# Patient Record
Sex: Female | Born: 1960
Health system: Southern US, Community
[De-identification: ages and names within clinical notes are randomized; demographics above are authoritative.]

## PROBLEM LIST (undated history)

## (undated) DIAGNOSIS — H8109 Meniere's disease, unspecified ear: Secondary | ICD-10-CM

## (undated) HISTORY — DX: Meniere's disease, unspecified ear: H81.09

---

## 1999-06-06 ENCOUNTER — Other Ambulatory Visit: Admission: RE | Admit: 1999-06-06 | Discharge: 1999-06-06 | Payer: Self-pay | Admitting: Internal Medicine

## 2000-12-25 ENCOUNTER — Other Ambulatory Visit: Admission: RE | Admit: 2000-12-25 | Discharge: 2000-12-25 | Payer: Self-pay | Admitting: Family Medicine

## 2001-11-16 ENCOUNTER — Encounter: Admission: RE | Admit: 2001-11-16 | Discharge: 2001-11-16 | Payer: Self-pay | Admitting: Urology

## 2001-11-16 ENCOUNTER — Encounter: Payer: Self-pay | Admitting: Urology

## 2002-02-03 ENCOUNTER — Other Ambulatory Visit: Admission: RE | Admit: 2002-02-03 | Discharge: 2002-02-03 | Payer: Self-pay | Admitting: Family Medicine

## 2002-04-04 ENCOUNTER — Ambulatory Visit (HOSPITAL_BASED_OUTPATIENT_CLINIC_OR_DEPARTMENT_OTHER): Admission: RE | Admit: 2002-04-04 | Discharge: 2002-04-04 | Payer: Self-pay | Admitting: Urology

## 2003-10-12 ENCOUNTER — Other Ambulatory Visit: Admission: RE | Admit: 2003-10-12 | Discharge: 2003-10-12 | Payer: Self-pay | Admitting: Family Medicine

## 2004-10-07 ENCOUNTER — Other Ambulatory Visit: Admission: RE | Admit: 2004-10-07 | Discharge: 2004-10-07 | Payer: Self-pay | Admitting: Family Medicine

## 2004-10-09 ENCOUNTER — Ambulatory Visit (HOSPITAL_COMMUNITY): Admission: RE | Admit: 2004-10-09 | Discharge: 2004-10-09 | Payer: Self-pay | Admitting: Family Medicine

## 2006-04-25 ENCOUNTER — Encounter: Admission: RE | Admit: 2006-04-25 | Discharge: 2006-04-25 | Payer: Self-pay | Admitting: Sports Medicine

## 2007-03-22 ENCOUNTER — Encounter: Admission: RE | Admit: 2007-03-22 | Discharge: 2007-03-22 | Payer: Self-pay | Admitting: Family Medicine

## 2007-04-07 ENCOUNTER — Ambulatory Visit: Payer: Self-pay | Admitting: Gastroenterology

## 2007-04-07 DIAGNOSIS — R945 Abnormal results of liver function studies: Secondary | ICD-10-CM | POA: Insufficient documentation

## 2010-05-08 ENCOUNTER — Other Ambulatory Visit (HOSPITAL_COMMUNITY)
Admission: RE | Admit: 2010-05-08 | Discharge: 2010-05-08 | Disposition: A | Discharge status: Home / Self Care | Payer: BC Managed Care – PPO | Source: Ambulatory Visit | Attending: Family Medicine | Admitting: Family Medicine

## 2010-05-08 ENCOUNTER — Other Ambulatory Visit: Payer: Self-pay | Admitting: Family Medicine

## 2010-05-08 DIAGNOSIS — Z0000 Encounter for general adult medical examination without abnormal findings: Secondary | ICD-10-CM | POA: Insufficient documentation

## 2010-05-21 NOTE — Assessment & Plan Note (Signed)
 HEALTHCARE                         GASTROENTEROLOGY OFFICE NOTE   NAME:Judy Middleton, Judy Middleton                        MRN:          [id number]  DATE:04/07/2007                            DOB:          Dec 04, 1960    REASON FOR CONSULTATION:  Abnormal liver tests.   Judy Middleton is a pleasant 50 year old white female referred through the  courtesy of Dr. Smith Mince for evaluation.  Abnormal liver tests were  noted in early February.  Several sets have demonstrated a very mild  transaminitis.  The transaminases have ranged from the low 40s to 64.  Serologies for hepatitis A, B, and C were negative, as was an ANA.  Last  set of liver tests on March 05, 2007 demonstrated an AST of 35 and an  ALT of 36.   Judy Middleton has no GI complaints, including abdominal pain, fever, change  of bowel habits.  There is no history of liver disease or jaundice.  She  does not use drugs and has not received a transfusion.   PAST MEDICAL HISTORY:  Pertinent for kidney stones.   FAMILY HISTORY:  Positive for a mother with celiac disease.  Grandmother  had colon cancer.   She is on no medications.   She has no allergies.   She does not smoke.  She drinks rarely.  She is married and works for a  [employer].   REVIEW OF SYSTEMS:  Positive for occasional joint pain.   PHYSICAL EXAMINATION:  There is no stigmata of liver disease.  Pulse 80, blood pressure 110/56.  HEENT:  EOMI.  PERRLA.  Sclerae are anicteric.  Conjunctivae are pink.  NECK:  Supple without thyromegaly, adenopathy or carotid bruits.  CHEST:  Clear to auscultation and percussion without adventitious  sounds.  CARDIAC:  Regular rhythm; normal S1 S2.  There are no murmurs, gallops  or rubs.  ABDOMEN:  Bowel sounds are normoactive.  Abdomen is soft, nontender and  nondistended.  There are no abdominal masses, tenderness, splenic  enlargement or hepatomegaly.  EXTREMITIES:  Full range of motion.  No  cyanosis, clubbing or edema.  RECTAL:  Deferred.   IMPRESSION:  Transient mild transaminitis:  This could represent a  subclinical viral infection with minimal liver test abnormalities.  There is no evidence for underlying liver disease.   RECOMMENDATION:  1. No further GI workup.  I would repeat her liver tests in      approximately three months.  2. Screening colonoscopy at age 26.     Barbette Hair. Arlyce Dice, MD,FACG  Electronically Signed    RDK/MedQ  DD: 04/07/2007  DT: 04/07/2007  Job #: [id number]   cc:   Talmadge Coventry, M.D.

## 2010-05-21 NOTE — Letter (Signed)
April 07, 2007    Judy Middleton. Judy Middleton   Judy Middleton, Judy Middleton  MRN:  [id number]  /  DOB:  1960-10-15   Dear [name]:   It is my pleasure to have treated you recently as a new patient in my  office.  I appreciate your confidence and the opportunity to participate  in your care.   Since I do have a busy inpatient endoscopy schedule and office schedule,  my office hours vary weekly.  I am, however, available for emergency  calls every day through my office.  If I cannot promptly meet an urgent  office appointment, another one of our gastroenterologists will be able  to assist you.   My well-trained staff are prepared to help you at all times.  For  emergencies after office hours, a physician from our gastroenterology  section is always available through my 24-hour answering service.   While you are under my care, I encourage discussion of your questions  and concerns, and I will be happy to return your calls as soon as I am  available.   Once again, I welcome you as a new patient and I look forward to a happy  and healthy relationship.    Sincerely,      Barbette Hair. Arlyce Dice, MD,FACG  Electronically Signed   RDK/MedQ  DD: 04/07/2007  DT: 04/07/2007  Job #: [id number]

## 2010-05-21 NOTE — Letter (Signed)
April 07, 2007    Talmadge Coventry, M.D.  [address]  [place], [place] [address]   RE:  Judy, Middleton  MRN:  [id number]  /  DOB:  Apr 17, 1960   Dear Dr. Smith Mince:   Upon your kind referral, I had the pleasure of evaluating your patient  and I am pleased to offer my findings.  I saw Judy Miyoshi. Middleton in the  office today.  Enclosed is a copy of my progress note that details my  findings and recommendations.   Thank you for the opportunity to participate in your patient's care.    Sincerely,      Barbette Hair. Arlyce Dice, MD,FACG  Electronically Signed    RDK/MedQ  DD: 04/07/2007  DT: 04/07/2007  Job #: [id number]

## 2010-05-24 NOTE — Op Note (Signed)
NAME:  Judy Middleton, Judy Middleton                           ACCOUNT NO.:  [id number]   MEDICAL RECORD NO.:  [id number]                   PATIENT TYPE:  AMB   LOCATION:  [facility]                                 FACILITY:  [facility]   PHYSICIAN:  Mark C. Vernie Ammons, M.D.               DATE OF BIRTH:  [birth year]   DATE OF PROCEDURE:  04/04/2002  DATE OF DISCHARGE:                                 OPERATIVE REPORT   PREOPERATIVE DIAGNOSES:  Gross hematuria, possible right distal ureteral  filling defect.   POSTOPERATIVE DIAGNOSES:  Hematuria.   SURGEON:  Mark C. Vernie Ammons, M.D.   ANESTHESIA:  General.   PROCEDURE:  Cystoscopy and bilateral retrograde pyelograms with  interpretation.   DRAINS:  None.   SPECIMENS:  None.   ESTIMATED BLOOD LOSS:  None.   COMPLICATIONS:  None.   INDICATIONS FOR PROCEDURE:  The patient is a 50 year old white female who  has had painless microscopic hematuria evaluated by me in the past. It seems  to occur after running. She has undergone a CT scan, cystoscopy and right  retrograde pyelogram all of which have showed no abnormality. Her cytology  has been checked and that has been negative; however, an IVP in November of  2003 showed what appeared to be a filling defect in the distal ureter. She  has since developed gross hematuria when she runs but a repeat IVP in my  office revealed no renal abnormalities and what may have again been a subtle  right distal ureteral filling defect. She was brought to the OR today for  further investigation of that.   DESCRIPTION OF PROCEDURE:  After informed consent, the patient brought to  the major OR, placed on the table and administered general LMA anesthesia.  She was removed to the dorsal lithotomy position and her genitalia was  sterilely prepped and draped. A 21 French cystoscope was then introduced in  the bladder, the bladder was fully inspected and noted to be free of tumor,  stones or inflammatory lesions. There was squamous  trigonal metaplasia. Both  ureteral orifices were of normal position. The left orifice was golf hole,  the right orifice was golf hole in configuration but much more patulous. No  other findings were identified, the urethra appeared normal as well.   Retrograde pyelogram was performed using a 6 [place] open ended ureteral  catheter passed into the distal right ureteral orifice. This study revealed,  with injection of 1/2 strength contrast, an entirely normal ureter  throughout its course. I filled the renal pelvis and noted the entire  collecting system is normal as well and then as I watched the contrast flow  down the ureter under direct fluoroscopic visualization, I noted no filling  defect or other abnormality whatsoever. The distal ureter was evaluated  several times with contrast being injected in under fluoroscopy and then  watched and drained. I am entirely convinced  there is no abnormality in the  distal right ureter at this time. The left ureter was also steadied with  retrograde pyelogram with no abnormal findings being identified on that side  either.   The patient was awakened and taken to the recovery room in stable and  satisfactory condition. She tolerated the procedure well with no  intraoperative complications. Currently the source of her hematuria remains  obscure although it seems to be related to her running. She could have a  small stone in her kidney but at this point it is undoubtedly from a benign  source and I feel confident in continuing to follow this conservatively.  Consider repeat CT scan again in six months.                                               Mark C. Vernie Ammons, M.D.    MCO/MEDQ  D:  04/04/2002  T:  04/04/2002  Job:  [id number]   cc:   Talmadge Coventry, M.D.  [address], [address]  [place]  [place] [address]  Fax: [phone]

## 2012-07-21 ENCOUNTER — Encounter: Payer: Self-pay | Admitting: Sports Medicine

## 2012-07-21 ENCOUNTER — Ambulatory Visit
Admission: RE | Admit: 2012-07-21 | Discharge: 2012-07-21 | Disposition: A | Discharge status: Home / Self Care | Payer: BC Managed Care – PPO | Source: Ambulatory Visit | Attending: Sports Medicine | Admitting: Sports Medicine

## 2012-07-21 ENCOUNTER — Ambulatory Visit (INDEPENDENT_AMBULATORY_CARE_PROVIDER_SITE_OTHER): Payer: BC Managed Care – PPO | Admitting: Sports Medicine

## 2012-07-21 VITALS — BP 108/74 | HR 62 | Ht 59.0 in | Wt 95.0 lb

## 2012-07-21 DIAGNOSIS — M25552 Pain in left hip: Secondary | ICD-10-CM

## 2012-07-21 DIAGNOSIS — M25562 Pain in left knee: Secondary | ICD-10-CM

## 2012-07-21 DIAGNOSIS — M25559 Pain in unspecified hip: Secondary | ICD-10-CM

## 2012-07-21 DIAGNOSIS — M25561 Pain in right knee: Secondary | ICD-10-CM

## 2012-07-21 DIAGNOSIS — M25569 Pain in unspecified knee: Secondary | ICD-10-CM

## 2012-07-21 NOTE — Progress Notes (Signed)
  Subjective:    Patient ID: Judy Middleton, female    DOB: June 15, 1960, 51 y.o.   MRN: [id number]  HPI chief complaint: Left hip pain  52 year old female comes in today complaining of 2-1/2 months of left hip pain. She experienced a sudden onset of sharp pain in the left groin while simply walking. She also felt a pop at the same time. Symptoms improved over the next couple of days but returned a few days later when she was playing tennis. As a result she has stopped playing tennis. Although her pain was initially in her groin she is now getting pain more diffusely around the entire left hip. Some pain in the posterior hip as well as along the lateral hip. She states that her pain has improved quite a bit over the past week or so and that now she just experiences an intermittent catch in the posterior portion of her hip but the most bothersome feature to her is a burning sensation in the left groin. Her pain does not result in her limping. She has not had any significant problems with her hip in the past. She denies any radiating pain down the leg. No associated numbness or tingling. No real pain at rest. No pain in the right hip.  She is also complaining of bilateral knee pain. It is mild in nature an aching in quality. She localizes it along the medial joint line. No mechanical symptoms. No swelling. No giving way. No recent trauma. No prior knee surgeries.  She is otherwise healthy. No known drug allergies.  Medications are reviewed Socially she does not smoke, drinks wine on occasion, and does not list an employer on her patient questionnaire    Review of Systems     Objective:   Physical Exam Well-developed, petite. No acute distress. Awake alert and oriented x3 vital signs are reviewed  Left hip: Smooth painless hip range of motion with a negative log roll. There is some slight tenderness to palpation at the origin of the adductor tendons but no real pain with resisted hip adduction. No  palpable defect. No soft tissue swelling. No pain with resisted hip flexion. She is tender to palpation slightly over the greater trochanteric bursa. Negative straight leg raise.  Examination of each knee shows full range of motion. No effusion. No significant patellofemoral crepitus. Mild tenderness to palpation along the medial joint line bilaterally but a negative McMurray's. No tenderness along the lateral joint line. Each knee is stable to ligamentous exam. Neurovascularly intact distally. Walking without significant limp.  X-rays including an AP pelvis, lateral left hip, as well as AP and lateral views of each knee are reviewed. She has minimal degenerative changes in her knees otherwise unremarkable. Hips show no significant degenerative changes and no signs of stress fracture.       Assessment & Plan:  1. Left hip pain secondary to abductor strain/possible partial tear 2. 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 Given her prescription to work with Ellamae Sia and she will followup with me in 4 weeks. I've reassured her that at this point in time I am not suspicious of stress fracture but I think it would be wise for her to wait for return to tennis. In fact, she tells me that she is planning on resting until fall tennis begins. If symptoms persist a followup I would consider merits of further diagnostic imaging.

## 2012-09-01 ENCOUNTER — Ambulatory Visit: Payer: BC Managed Care – PPO | Admitting: Sports Medicine

## 2012-11-11 ENCOUNTER — Other Ambulatory Visit: Payer: Self-pay | Admitting: Otolaryngology

## 2012-11-11 DIAGNOSIS — H903 Sensorineural hearing loss, bilateral: Secondary | ICD-10-CM

## 2012-11-21 ENCOUNTER — Ambulatory Visit
Admission: RE | Admit: 2012-11-21 | Discharge: 2012-11-21 | Disposition: A | Discharge status: Home / Self Care | Payer: BC Managed Care – PPO | Source: Ambulatory Visit | Attending: Otolaryngology | Admitting: Otolaryngology

## 2012-11-21 ENCOUNTER — Other Ambulatory Visit: Payer: BC Managed Care – PPO

## 2012-11-21 ENCOUNTER — Other Ambulatory Visit: Payer: Self-pay | Admitting: Otolaryngology

## 2012-11-21 DIAGNOSIS — H903 Sensorineural hearing loss, bilateral: Secondary | ICD-10-CM

## 2012-11-21 MED ORDER — GADOBENATE DIMEGLUMINE 529 MG/ML IV SOLN
9.0000 mL | Freq: Once | INTRAVENOUS | Status: AC | PRN
  Administered 2012-11-21: 9 mL via INTRAVENOUS

## 2012-12-01 ENCOUNTER — Other Ambulatory Visit (HOSPITAL_COMMUNITY): Payer: Self-pay | Admitting: Interventional Radiology

## 2012-12-01 DIAGNOSIS — I729 Aneurysm of unspecified site: Secondary | ICD-10-CM

## 2012-12-03 ENCOUNTER — Other Ambulatory Visit (HOSPITAL_COMMUNITY): Payer: Self-pay | Admitting: Interventional Radiology

## 2012-12-03 ENCOUNTER — Ambulatory Visit (HOSPITAL_COMMUNITY)
Admission: RE | Admit: 2012-12-03 | Discharge: 2012-12-03 | Disposition: A | Discharge status: Home / Self Care | Payer: BC Managed Care – PPO | Source: Ambulatory Visit | Attending: Interventional Radiology | Admitting: Interventional Radiology

## 2012-12-03 DIAGNOSIS — H9312 Tinnitus, left ear: Secondary | ICD-10-CM

## 2012-12-03 DIAGNOSIS — I729 Aneurysm of unspecified site: Secondary | ICD-10-CM

## 2012-12-06 ENCOUNTER — Encounter (HOSPITAL_COMMUNITY): Payer: Self-pay | Admitting: Pharmacy Technician

## 2012-12-06 ENCOUNTER — Other Ambulatory Visit: Payer: Self-pay | Admitting: Radiology

## 2012-12-06 ENCOUNTER — Other Ambulatory Visit (HOSPITAL_COMMUNITY): Payer: Self-pay | Admitting: Radiology

## 2012-12-06 HISTORY — PX: CEREBRAL ANGIOGRAM: SHX1326

## 2012-12-08 ENCOUNTER — Ambulatory Visit (HOSPITAL_COMMUNITY)
Admission: RE | Admit: 2012-12-08 | Discharge: 2012-12-08 | Disposition: A | Discharge status: Home / Self Care | Payer: BC Managed Care – PPO | Source: Ambulatory Visit | Attending: Interventional Radiology | Admitting: Interventional Radiology

## 2012-12-08 ENCOUNTER — Other Ambulatory Visit (HOSPITAL_COMMUNITY): Payer: Self-pay | Admitting: Interventional Radiology

## 2012-12-08 ENCOUNTER — Encounter (HOSPITAL_COMMUNITY): Payer: Self-pay

## 2012-12-08 DIAGNOSIS — I729 Aneurysm of unspecified site: Secondary | ICD-10-CM

## 2012-12-08 DIAGNOSIS — I671 Cerebral aneurysm, nonruptured: Secondary | ICD-10-CM | POA: Insufficient documentation

## 2012-12-08 DIAGNOSIS — H9319 Tinnitus, unspecified ear: Secondary | ICD-10-CM | POA: Insufficient documentation

## 2012-12-08 DIAGNOSIS — H9312 Tinnitus, left ear: Secondary | ICD-10-CM

## 2012-12-08 DIAGNOSIS — Z79899 Other long term (current) drug therapy: Secondary | ICD-10-CM | POA: Insufficient documentation

## 2012-12-08 LAB — CBC WITH DIFFERENTIAL/PLATELET
Basophils Absolute: 0 10*3/uL (ref 0.0–0.1)
Basophils Relative: 1 % (ref 0–1)
Eosinophils Absolute: 0.2 10*3/uL (ref 0.0–0.7)
Eosinophils Relative: 3 % (ref 0–5)
Lymphocytes Relative: 35 % (ref 12–46)
MCHC: 33.7 g/dL (ref 30.0–36.0)
MCV: 88.3 fL (ref 78.0–100.0)
Monocytes Absolute: 0.4 10*3/uL (ref 0.1–1.0)
Monocytes Relative: 7 % (ref 3–12)
Neutro Abs: 2.8 10*3/uL (ref 1.7–7.7)
Platelets: 223 10*3/uL (ref 150–400)
RBC: 4.61 MIL/uL (ref 3.87–5.11)
RDW: 12.4 % (ref 11.5–15.5)
WBC: 5.2 10*3/uL (ref 4.0–10.5)

## 2012-12-08 LAB — APTT: aPTT: 26 seconds (ref 24–37)

## 2012-12-08 LAB — PROTIME-INR: Prothrombin Time: 12.2 seconds (ref 11.6–15.2)

## 2012-12-08 LAB — BASIC METABOLIC PANEL
BUN: 14 mg/dL (ref 6–23)
CO2: 28 mEq/L (ref 19–32)
Calcium: 9.7 mg/dL (ref 8.4–10.5)
Creatinine, Ser: 0.66 mg/dL (ref 0.50–1.10)
GFR calc Af Amer: >90 mL/min (ref >90)

## 2012-12-08 MED ORDER — IOHEXOL 300 MG/ML  SOLN
150.0000 mL | Freq: Once | INTRAMUSCULAR | Status: AC | PRN
  Administered 2012-12-08: 130 mL via INTRA_ARTERIAL

## 2012-12-08 MED ORDER — SODIUM CHLORIDE 0.9 % IV SOLN: INTRAVENOUS | Status: AC

## 2012-12-08 MED ORDER — ONDANSETRON HCL 4 MG/2ML IJ SOLN: 4.0000 mg | Freq: Once | INTRAMUSCULAR | Status: DC

## 2012-12-08 MED ORDER — SODIUM CHLORIDE 0.9 % IV SOLN
Freq: Once | INTRAVENOUS | Status: AC
  Administered 2012-12-08: 75 mL/h via INTRAVENOUS

## 2012-12-08 MED ORDER — IODIXANOL 320 MG/ML IV SOLN
100.0000 mL | Freq: Once | INTRAVENOUS | Status: AC | PRN
  Administered 2012-12-08: 21 mL via INTRAVENOUS

## 2012-12-08 MED ORDER — FENTANYL CITRATE 0.05 MG/ML IJ SOLN
INTRAMUSCULAR | Status: AC | PRN
  Administered 2012-12-08: 12.5 ug via INTRAVENOUS
  Administered 2012-12-08 (×2): 25 ug via INTRAVENOUS
  Administered 2012-12-08: 12.5 ug via INTRAVENOUS
  Administered 2012-12-08: 25 ug via INTRAVENOUS

## 2012-12-08 MED ORDER — FENTANYL CITRATE 0.05 MG/ML IJ SOLN
INTRAMUSCULAR | Status: AC
  Filled 2012-12-08: qty 4

## 2012-12-08 MED ORDER — MIDAZOLAM HCL 2 MG/2ML IJ SOLN
INTRAMUSCULAR | Status: AC | PRN
  Administered 2012-12-08: 0.5 mg via INTRAVENOUS
  Administered 2012-12-08 (×2): 1 mg via INTRAVENOUS
  Administered 2012-12-08: 0.5 mg via INTRAVENOUS
  Administered 2012-12-08: 1 mg via INTRAVENOUS

## 2012-12-08 MED ORDER — HEPARIN SODIUM (PORCINE) 1000 UNIT/ML IJ SOLN
INTRAMUSCULAR | Status: AC | PRN
  Administered 2012-12-08: 500 [IU] via INTRAVENOUS
  Administered 2012-12-08: 1000 [IU] via INTRAVENOUS

## 2012-12-08 MED ORDER — MIDAZOLAM HCL 2 MG/2ML IJ SOLN
INTRAMUSCULAR | Status: AC
  Filled 2012-12-08: qty 6

## 2012-12-08 MED ORDER — ONDANSETRON HCL 4 MG/2ML IJ SOLN
INTRAMUSCULAR | Status: AC
  Administered 2012-12-08: 11:00:00
  Filled 2012-12-08: qty 2

## 2012-12-08 NOTE — H&P (Signed)
Chief Complaint: "Left ear ringing, fullness and pulsatile sound."  HPI: Judy Middleton is an 52 y.o. female who has been seen in consult on 12/03/12 for abnormal MRI/MRA findings and symptoms of left ear fullness starting September 05, 2012 after landing in a plane. She states her symptoms have progressed to ringing worse when rotating her head to the right, and pulsatile sound worse with climbing stairs or exercising. She denies any headaches, extremity weakness, slurred speech, or syncope. She states her symptoms have been getting progressively worse. She denies any known allergy to iodinated contrast or difficulty with sedation. She denies any chest pain or shortness of breath today. She denies taking any blood thinners or active bleeding, blood in her stool or urine. She denies any recent illness, fever or chills. 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 The patient is here today for a diagnostic cerebral arteriogram.  Past Medical History: History reviewed. No pertinent past medical history.  Past Surgical History: Exploratory Laparotomy   Family History: No family history on file.  Social History:  reports that she has never smoked. She has never used smokeless tobacco. Her alcohol and drug histories are not on file.  Allergies: No Known Allergies    Medication List    ASK your doctor about these medications       mometasone 50 MCG/ACT nasal spray  Commonly known as:  NASONEX  Place 2 sprays into both nostrils daily as needed (congestion/allergies).     MULTIPLE VITAMINS/WOMENS PO  Take 1 tablet by mouth daily.     PREMARIN vaginal cream  Generic drug:  conjugated estrogens  Place 1 Applicatorful vaginally 2 (two) times a week.     SYSTANE BALANCE 0.6 % Soln  Generic drug:  Propylene Glycol  Place 1-2 drops into both eyes daily as needed (dryness).     VAGIFEM 10 MCG Tabs vaginal tablet  Generic drug:   Estradiol  Place 1 tablet vaginally 2 (two) times a week.     VIACTIV 500-500-40 MG-UNT-MCG Chew  Generic drug:  Calcium-Vitamin D-Vitamin K  Chew 1 tablet by mouth daily.       Please HPI for pertinent positives, otherwise complete 10 system ROS negative.  Physical Exam: BP 108/70  Pulse 64  Temp(Src) 98 F (36.7 C) (Oral)  Resp 18  Ht 4\' 10"  (1.473 m)  Wt 95 lb (43.092 kg)  BMI 19.86 kg/m2  SpO2 98% Body mass index is 19.86 kg/(m^2).  General Appearance:  Alert, cooperative, no distress  Head:  Normocephalic, without obvious abnormality, atraumatic  Neck: Supple, symmetrical, trachea midline  Lungs:   Clear to auscultation bilaterally, no w/r/r, respirations unlabored without use of accessory muscles.  Chest Wall:  No tenderness or deformity  Heart:  Regular rate and rhythm, S1, S2 normal, no murmur, rub or gallop.  Extremities: Extremities normal, atraumatic, no cyanosis or edema  Pulses: 2+ and symmetric  Neurologic: Normal affect, no gross deficits.   Results for orders placed during the hospital encounter of 12/08/12 (from the past 48 hour(s))  APTT     Status: None   Collection Time    12/08/12  8:16 AM      Result Value Range   aPTT 26  24 - 37 seconds  CBC WITH DIFFERENTIAL     Status: None   Collection Time    12/08/12  8:16 AM      Result Value Range   WBC  5.2  4.0 - 10.5 K/uL   RBC 4.61  3.87 - 5.11 MIL/uL   Hemoglobin 13.7  12.0 - 15.0 g/dL   HCT 13.2  44.0 - 10.2 %   MCV 88.3  78.0 - 100.0 fL   MCH 29.7  26.0 - 34.0 pg   MCHC 33.7  30.0 - 36.0 g/dL   RDW 72.5  36.6 - 44.0 %   Platelets 223  150 - 400 K/uL   Neutrophils Relative % 55  43 - 77 %   Neutro Abs 2.8  1.7 - 7.7 K/uL   Lymphocytes Relative 35  12 - 46 %   Lymphs Abs 1.8  0.7 - 4.0 K/uL   Monocytes Relative 7  3 - 12 %   Monocytes Absolute 0.4  0.1 - 1.0 K/uL   Eosinophils Relative 3  0 - 5 %   Eosinophils Absolute 0.2  0.0 - 0.7 K/uL   Basophils Relative 1  0 - 1 %   Basophils  Absolute 0.0  0.0 - 0.1 K/uL  PROTIME-INR     Status: None   Collection Time    12/08/12  8:16 AM      Result Value Range   Prothrombin Time 12.2  11.6 - 15.2 seconds   INR 0.92  0.00 - 1.49   No results found.  Assessment/Plan Left ear tinnitus. Left carotid artery infundibulum versus aneurysm on MRI/MRA  Scheduled for cerebral arteriogram today. Patient has been NPO, labs and images reviewed, no blood thinners Risks and Benefits discussed with the patient. All of the patient's questions were answered, patient is agreeable to proceed. Consent signed and in chart.   Pattricia Boss D PA-C 12/08/2012, 9:05 AM

## 2012-12-08 NOTE — Procedures (Signed)
S/P 4 vessel cerebral arteriogram. RT CFa approach. Findings. 1.appro 4.5 mm x 7.9mm LT vertebral artery  X 2 fusiform aneurysms  Involving horizontal segment , associated with a small1.67mm saccular aneurysm associated with 50 % stenosis.  2.No aneurysm seen on Lt ICA intracranially.

## 2012-12-08 NOTE — ED Notes (Signed)
MD at bedside. 

## 2012-12-08 NOTE — Progress Notes (Signed)
Discharge instructions given by Darcel Smalling, RN.  Pt and family verbalize understanding

## 2012-12-08 NOTE — ED Notes (Signed)
Patient denies pain and is resting comfortably.  

## 2012-12-08 NOTE — ED Notes (Signed)
Patient denies pain and is resting comfortably. She does state she is feeling very anxious

## 2012-12-08 NOTE — ED Notes (Signed)
Family updated as to patient's status per MD 

## 2012-12-08 NOTE — ED Notes (Signed)
Pt denies nausea at this time but is worried she will. She has requested to be given zofran. VO order received from MD, zofran to be administered per pt request

## 2013-01-27 ENCOUNTER — Other Ambulatory Visit (HOSPITAL_COMMUNITY): Payer: Self-pay | Admitting: Interventional Radiology

## 2013-01-27 DIAGNOSIS — H9319 Tinnitus, unspecified ear: Secondary | ICD-10-CM

## 2013-02-17 ENCOUNTER — Telehealth (HOSPITAL_COMMUNITY): Payer: Self-pay | Admitting: Interventional Radiology

## 2013-02-17 NOTE — Telephone Encounter (Signed)
Called pt left VM to confirm March appt and to [facility]

## 2013-03-11 ENCOUNTER — Other Ambulatory Visit (HOSPITAL_COMMUNITY): Payer: Self-pay | Admitting: Interventional Radiology

## 2013-03-11 ENCOUNTER — Ambulatory Visit (HOSPITAL_COMMUNITY)
Admission: RE | Admit: 2013-03-11 | Discharge: 2013-03-11 | Disposition: A | Discharge status: Home / Self Care | Payer: BC Managed Care – PPO | Source: Ambulatory Visit | Attending: Interventional Radiology | Admitting: Interventional Radiology

## 2013-03-11 DIAGNOSIS — I720 Aneurysm of carotid artery: Secondary | ICD-10-CM

## 2013-03-11 DIAGNOSIS — H9319 Tinnitus, unspecified ear: Secondary | ICD-10-CM

## 2013-03-11 DIAGNOSIS — I6529 Occlusion and stenosis of unspecified carotid artery: Secondary | ICD-10-CM | POA: Insufficient documentation

## 2013-03-11 DIAGNOSIS — I7774 Dissection of vertebral artery: Secondary | ICD-10-CM | POA: Insufficient documentation

## 2013-03-11 MED ORDER — IOHEXOL 350 MG/ML SOLN
50.0000 mL | Freq: Once | INTRAVENOUS | Status: AC | PRN
  Administered 2013-03-11: 50 mL via INTRAVENOUS

## 2013-03-16 ENCOUNTER — Other Ambulatory Visit (HOSPITAL_COMMUNITY): Payer: Self-pay | Admitting: Interventional Radiology

## 2013-03-16 DIAGNOSIS — I7774 Dissection of vertebral artery: Secondary | ICD-10-CM

## 2013-03-16 DIAGNOSIS — H9319 Tinnitus, unspecified ear: Secondary | ICD-10-CM

## 2013-03-18 ENCOUNTER — Ambulatory Visit (HOSPITAL_COMMUNITY): Admission: RE | Admit: 2013-03-18 | Payer: BC Managed Care – PPO | Source: Ambulatory Visit

## 2013-08-19 ENCOUNTER — Ambulatory Visit (INDEPENDENT_AMBULATORY_CARE_PROVIDER_SITE_OTHER): Payer: Self-pay | Admitting: Internal Medicine

## 2013-08-19 DIAGNOSIS — Z7189 Other specified counseling: Secondary | ICD-10-CM

## 2013-08-19 DIAGNOSIS — Z7184 Encounter for health counseling related to travel: Secondary | ICD-10-CM

## 2013-08-19 MED ORDER — ATOVAQUONE-PROGUANIL HCL 250-100 MG PO TABS: 1.0000 | ORAL_TABLET | Freq: Every day | ORAL | Status: DC

## 2013-08-19 MED ORDER — AZITHROMYCIN 500 MG PO TABS: 1000.0000 mg | ORAL_TABLET | Freq: Once | ORAL | Status: DC

## 2013-08-19 NOTE — Progress Notes (Signed)
  Subjective:    Judy Middleton is a 53 y.o. female who presents to the Infectious Disease clinic for travel consultation. Planned departure date: October 2015          Planned return date: 8 days Countries of travel: [place]  Areas in country: [place]   Accommodations: compound Purpose of travel: health care Prior travel out of [place]: yes Currently ill / Fever: no History of liver or kidney disease: no  Data Review:  no issues   Review of Systems n/a    Objective:    n/a    Assessment:    No contraindications to travel. none      Plan:    Issues discussed: environmental concerns, future shots, insect-borne illnesses, malaria, MVA safety, rabies, safe food/water, traveler's diarrhea, website/handouts for more information, what to do if ill upon return and what to do if ill while there. Immunizations recommended: none indicated.up to date with typhoid, hepatitis A, B Malaria prophylaxis: malarone, daily dose starting 1-2 days before entering endemic area, ending 7 days after leaving area Traveler's diarrhea prophylaxis: azithromycin.doesn't tolerate cipro Total duration of visit: 1 Hour. Total time spent on education, counseling, coordination of care: 30 Minutes.

## 2013-08-29 ENCOUNTER — Encounter: Payer: Self-pay | Admitting: Nurse Practitioner

## 2013-08-29 ENCOUNTER — Ambulatory Visit (INDEPENDENT_AMBULATORY_CARE_PROVIDER_SITE_OTHER): Payer: BC Managed Care – PPO | Admitting: Nurse Practitioner

## 2013-08-29 VITALS — BP 100/70 | HR 68 | Ht <= 58 in | Wt 90.0 lb

## 2013-08-29 DIAGNOSIS — Z0000 Encounter for general adult medical examination without abnormal findings: Secondary | ICD-10-CM

## 2013-08-29 DIAGNOSIS — Z01419 Encounter for gynecological examination (general) (routine) without abnormal findings: Secondary | ICD-10-CM

## 2013-08-29 MED ORDER — ESTRADIOL 2 MG VA RING: 2.0000 mg | VAGINAL_RING | VAGINAL | Status: DC

## 2013-08-29 NOTE — Progress Notes (Signed)
Patient ID: Judy Middleton, female   DOB: 1960/12/27, 53 y.o.   MRN: [id number] 53 y.o. [id number] Married Caucasian Fe here for NGYN annual exam.  She has problems with vaginal dryness and UTI secondary to atrophy.  She is currently using Vagifem 10 mcg twice weekly and pea size amount of Premarin to the urethra.  Initially this was helpful but no so much now.  She has dyspareunia most of the time.  She is interested in other vaginal methods of estrogen replacement.  She has otherwise been very healthy and feels well.  Patient's last menstrual period was 01/07/2012.          Sexually active: Yes.    The current method of family planning is post menopausal status.    Exercising: Yes.    Gym/ health club routine includes running, walking and tennis. Smoker:  no  Health Maintenance: Pap:  05/08/10, negative, no history of abnormal MMG:  11/2012, normal per patient Colonoscopy:  02/2010, normal, repeat in 5 years due to family history of colon cancer BMD:   2011, normal per patient TDaP:  2007 Labs: PCP, Dr. Merri Brunette, have apt in January   reports that she has never smoked. She has never used smokeless tobacco. She reports that she drinks about 1.8 ounces of alcohol per week. She reports that she does not use illicit drugs.  Past Medical History  Diagnosis Date  . Cochlear hydrops     Past Surgical History  Procedure Laterality Date  . Cerebral angiogram  12/2012    Hydrops of the left ear. Vascular Neuro thought nothing acute.  CT angiogram 3/15 was stable.    Current Outpatient Prescriptions  Medication Sig Dispense Refill  . Calcium-Vitamin D-Vitamin K (VIACTIV) 500-500-40 MG-UNT-MCG CHEW Chew 1 tablet by mouth daily.      . mometasone (NASONEX) 50 MCG/ACT nasal spray Place 2 sprays into both nostrils daily as needed (congestion/allergies).      . Multiple Vitamins-Minerals (MULTIPLE VITAMINS/WOMENS PO) Take 1 tablet by mouth daily.      . potassium chloride (K-DUR) 10 MEQ tablet Take  2 tablets by mouth daily.      Marland Kitchen PREMARIN vaginal cream Place 1 Applicatorful vaginally 2 (two) times a week.       Marland Kitchen Propylene Glycol (SYSTANE BALANCE) 0.6 % SOLN Place 1-2 drops into both eyes daily as needed (dryness).      . triamterene-hydrochlorothiazide (DYAZIDE) 37.5-25 MG per capsule Take 1 capsule by mouth daily.      Marland Kitchen atovaquone-proguanil (MALARONE) 250-100 MG TABS Take 1 tablet by mouth daily. Start 2 days prior to travel to malaria area, throughout travel and for 7 days upon return.  17 tablet  0  . azithromycin (ZITHROMAX) 500 MG tablet Take 2 tablets (1,000 mg total) by mouth once. Take 2 tabs once for Traveler's diarrhea  4 tablet  0  . estradiol (ESTRING) 2 MG vaginal ring Place 2 mg vaginally every 3 (three) months. Insert a new ring into vagina every 3 months  3 each  3   No current facility-administered medications for this visit.    Family History  Problem Relation Age of Onset  . Osteoporosis Mother   . Celiac disease Mother   . Heart disease Maternal Grandmother   . Heart attack Maternal Grandmother   . Heart attack Maternal Grandfather   . Heart disease Maternal Grandfather   . Parkinson's disease Paternal Grandfather   . Colon cancer Paternal Grandfather     ROS:  Pertinent items are noted in HPI.  Otherwise, a comprehensive ROS was negative.  Exam:   BP 100/70  Pulse 68  Ht 4' 9.5" (1.461 m)  Wt 90 lb (40.824 kg)  BMI 19.13 kg/m2  LMP 01/07/2012 Height: 4' 9.5" (146.1 cm)  Ht Readings from Last 3 Encounters:  08/29/13 4' 9.5" (1.461 m)  12/08/12  (1.473 m)  07/21/12  (1.499 m)    General appearance: alert, cooperative and appears stated age Head: Normocephalic, without obvious abnormality, atraumatic Neck: no adenopathy, supple, symmetrical, trachea midline and thyroid normal to inspection and palpation Lungs: clear to auscultation bilaterally Breasts: normal appearance, no masses or tenderness Heart: regular rate and rhythm Abdomen:  soft, non-tender; no masses,  no organomegaly Extremities: extremities normal, atraumatic, no cyanosis or edema Skin: Skin color, texture, turgor normal. No rashes or lesions Lymph nodes: Cervical, supraclavicular, and axillary nodes normal. No abnormal inguinal nodes palpated Neurologic: Grossly normal   Pelvic: External genitalia:  no lesions, external atrophic signs              Urethra:  normal appearing urethra with no masses, tenderness or lesions              Bartholin's and Skene's: normal                 Vagina: atrophic appearing vagina with pale color and discharge, no lesions              Cervix: anteverted              Pap taken: Yes.   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    She is willing to try the Estring to see if helpful.  Also compliance may be better for her.  Plan to see her back in 3 months with ring in place and discuss removal and to see how easy it will be for her.  Otherwise we can remove as needed.  RX given for Estring # 1/ RF X 3  Counseled with risk of DVT, CVA, cancer, etc.  She will also use EVOO with a OB tampon every few days and wear the tampon for an hour or so and remove.  Counseled on breast self exam, mammography screening, use and side effects of HRT, menopause, osteoporosis, adequate intake of calcium and vitamin D, diet and exercise, Kegel's exercises return  annually or prn  An After Visit Summary was printed and given to the patient.

## 2013-08-29 NOTE — Patient Instructions (Signed)

## 2013-09-01 LAB — IPS PAP TEST WITH HPV

## 2013-09-04 NOTE — Progress Notes (Signed)
Encounter reviewed by Dr. Brook Silva.  

## 2013-10-21 ENCOUNTER — Other Ambulatory Visit (HOSPITAL_COMMUNITY): Payer: Self-pay | Admitting: Interventional Radiology

## 2013-10-21 ENCOUNTER — Telehealth (HOSPITAL_COMMUNITY): Payer: Self-pay | Admitting: Interventional Radiology

## 2013-10-21 DIAGNOSIS — I7774 Dissection of vertebral artery: Secondary | ICD-10-CM

## 2013-10-21 DIAGNOSIS — H9319 Tinnitus, unspecified ear: Secondary | ICD-10-CM

## 2013-10-21 NOTE — Telephone Encounter (Signed)
Called pt, left message w/ her husband for her to call to schedule her 6 month f/u MRI/MRA brain. JM

## 2013-10-24 ENCOUNTER — Other Ambulatory Visit: Payer: Self-pay | Admitting: Neurosurgery

## 2013-10-24 DIAGNOSIS — I7774 Dissection of vertebral artery: Secondary | ICD-10-CM

## 2013-11-07 ENCOUNTER — Encounter: Payer: Self-pay | Admitting: Nurse Practitioner

## 2013-11-25 ENCOUNTER — Encounter: Payer: Self-pay | Admitting: Nurse Practitioner

## 2013-11-25 ENCOUNTER — Ambulatory Visit (INDEPENDENT_AMBULATORY_CARE_PROVIDER_SITE_OTHER): Payer: BC Managed Care – PPO | Admitting: Nurse Practitioner

## 2013-11-25 VITALS — BP 100/70 | HR 72 | Resp 16 | Ht <= 58 in | Wt 94.0 lb

## 2013-11-25 DIAGNOSIS — N952 Postmenopausal atrophic vaginitis: Secondary | ICD-10-CM

## 2013-11-25 MED ORDER — ESTROGENS, CONJUGATED 0.625 MG/GM VA CREA: TOPICAL_CREAM | VAGINAL | Status: DC

## 2013-11-25 NOTE — Patient Instructions (Signed)
Continue Estring and Premarin vaginal cream pea size amount to the urethra twice weekly

## 2013-11-25 NOTE — Progress Notes (Signed)
53 y.o. WM female [id number] here for follow up of atrophic vaginitis treated with Estring vaginal ring initiated on 08/29/13.  She was using the Vagifem twice weekly and Premarin vaginal cream at the urethra for dyspareunia and recurrent UTI's.  She felt like the Vagifem was not helpful and SA was very painful.  She desired another course of treatment.  The Estring was then given and she has removed it this am without problems.  She feels better over all with dryness but feels she still has to use the Premarin at the urethra to reduce spasm and urethritis symptoms,  SA has been much more comfortable.   Denies any symptoms of UTI, vaginal bleeding, or spotting.    O: Healthy WD,WN female Affect: normal Abdomen: soft and non tender Pelvic exam:EXTERNAL GENITALIA: normal appearing with some atrophic changes and vulva with no masses, tenderness or lesions, urethral prolapse is slight better VAGINA: atrophic changes are improved, no abnormal discharge or lesions CERVIX: no lesions or cervical motion tenderness UTERUS: gravid and anteverted ADNEXA: no masses palpable and non tender  A: Atrophic vaginitis improved with Estring     P:  Discussed findings of atrophic vaginitis  Labs none indicated  Recheck at AEX  Will continue with Premarin pea size amount to urethra  Will continue with Estring    RV

## 2013-11-27 NOTE — Progress Notes (Signed)
Encounter reviewed by Dr. Micala Saltsman Silva.  

## 2014-02-10 IMAGING — CR DG PELVIS 1-2V
1 series · 1 of 1 positions shown · non-contrast
Comparison: CT urogram of 07/03/2004

CLINICAL DATA: Left hip pain

PELVIS - 1-2 VIEW

[t pelvis a.p.]
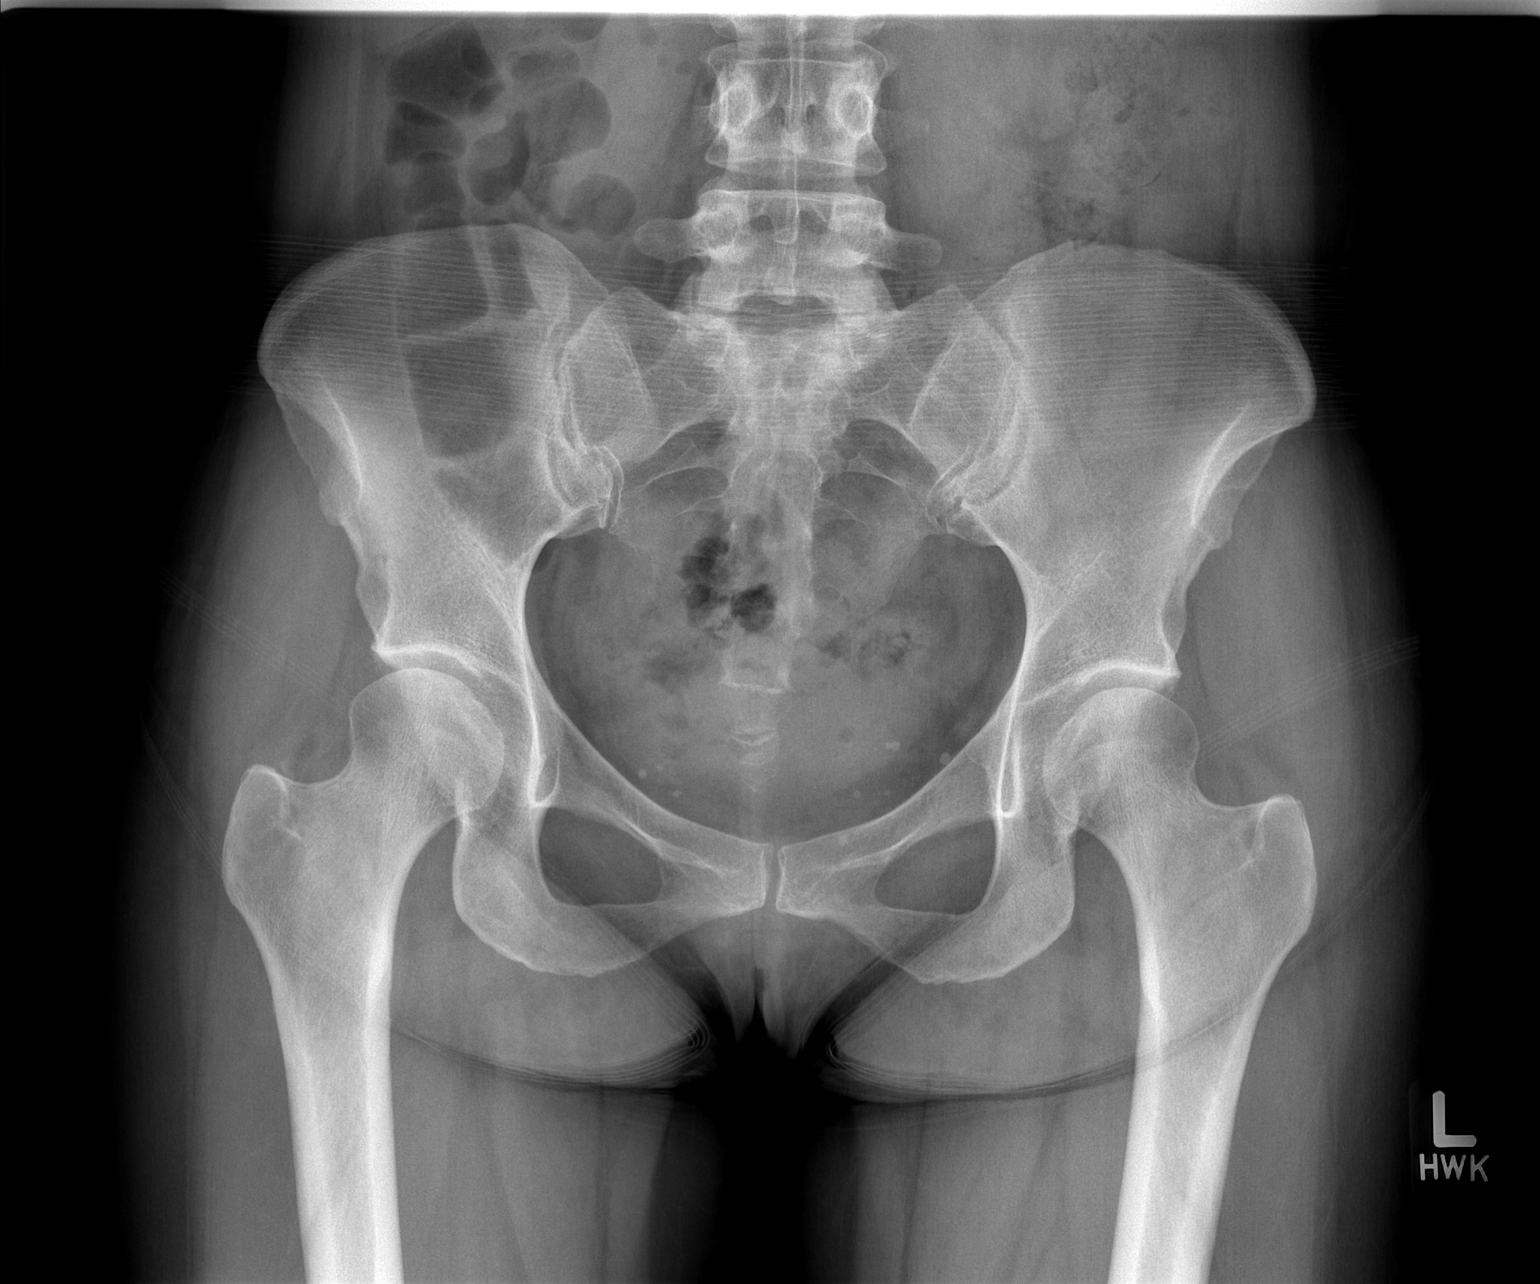

[1 of 1 positions shown; findings below may reference images not displayed]

FINDINGS: Both hip joint spaces appear normal.  No significant
degenerative change is seen.  The pelvic rami are intact, and the
SI joints appear normal.
IMPRESSION: Negative views of the hips.

## 2014-03-13 ENCOUNTER — Other Ambulatory Visit: Payer: Self-pay | Admitting: *Deleted

## 2014-03-13 NOTE — Telephone Encounter (Signed)
Fax Medication refill request: Estring 7.5 mcg  Last AEX:  08/29/13 with PG  Next AEX: 09/04/14 with PG Last MMG (if hormonal medication request): 11/2012 normal per patient according to AEX note  Refill authorized: #3/1 rfs, please advise.

## 2014-03-13 NOTE — Telephone Encounter (Signed)
See if Mammo is current with another in 11/2013??

## 2014-03-14 MED ORDER — ESTRADIOL 2 MG VA RING: 2.0000 mg | VAGINAL_RING | VAGINAL | Status: DC

## 2014-03-14 NOTE — Telephone Encounter (Signed)
No refills unless Mammo is done.

## 2014-03-14 NOTE — Telephone Encounter (Signed)
Called and s/w patient she said she had it done in November of 2015. Called [facility] she had it done 11/24/13 requested report.  Results: Breast Density Category D: Bi-Rads 1: Negative   (Report is in your door)  Patient says she knows you sent in her refills x 1 year to local pharmacy but she has to use mail order now. Please advise.

## 2014-06-30 IMAGING — XA IR ANGIO INTRA EXTRACRAN SEL COM CAROTID INNOMINATE BILAT MOD SE
1 series · 11 of 24 positions shown · IV contrast (IODINE)
Comparison: none

CLINICAL DATA: Left-sided pulsatile tinnitus. Abnormal MRA of the
brain.

[Series 300: neuro · 11 of 157 slices shown]
[im 7/157]
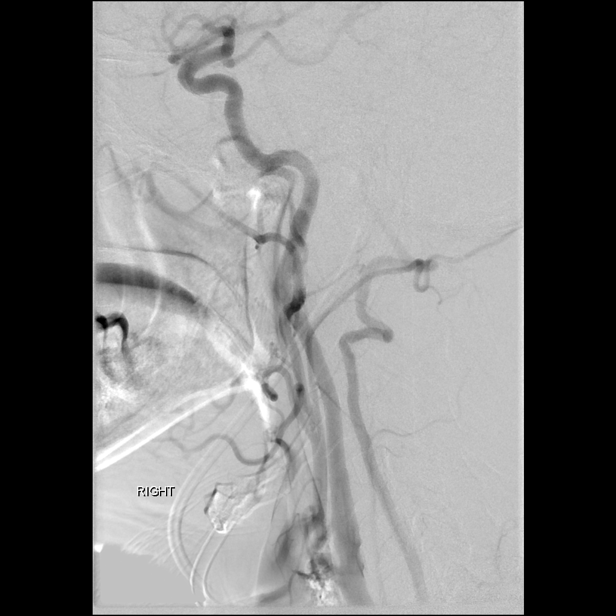
[im 21/157]
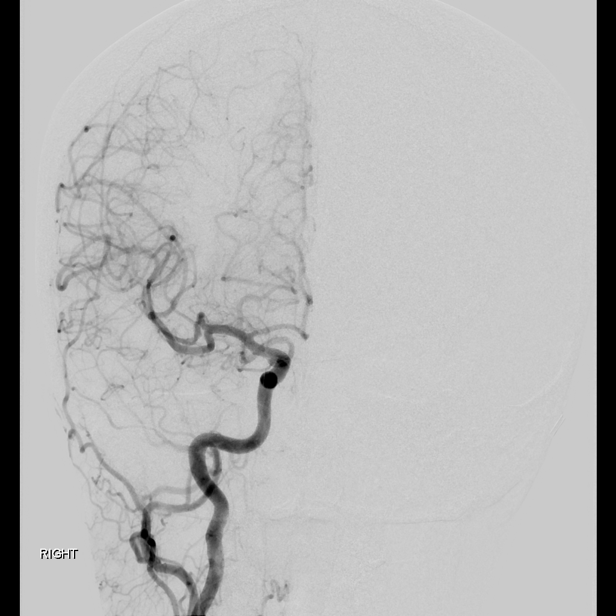
[im 34/157]
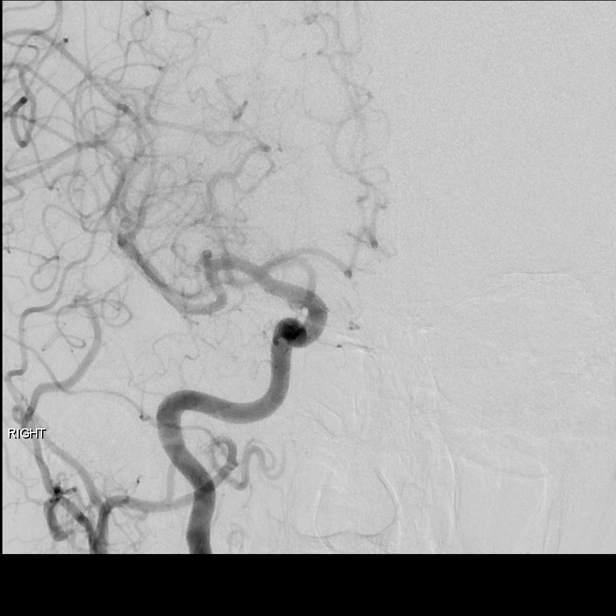
[im 48/157]
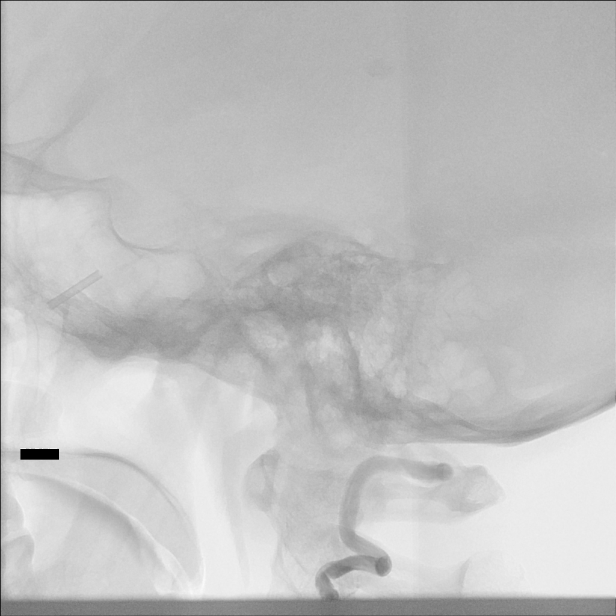
[im 62/157]
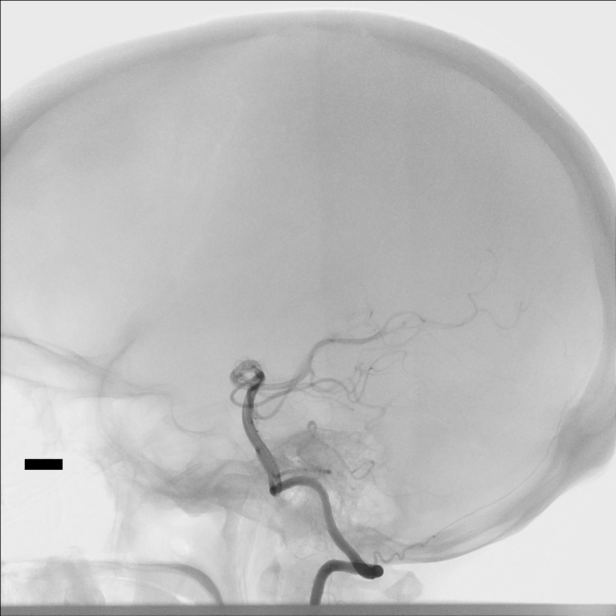
[im 82/157]
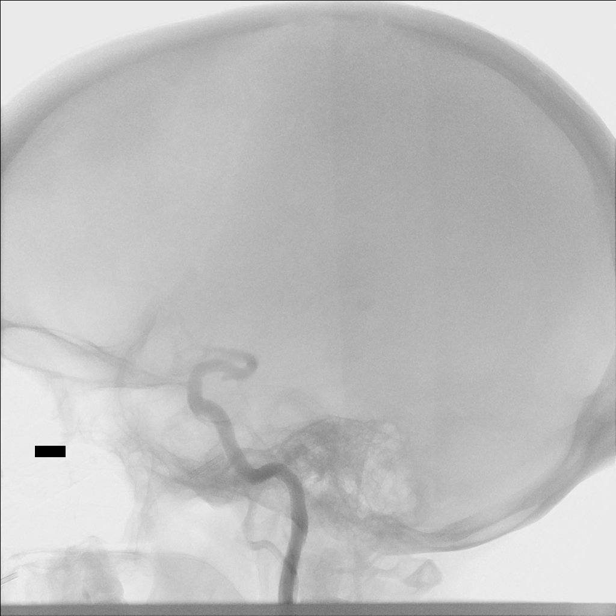
[im 95/157]
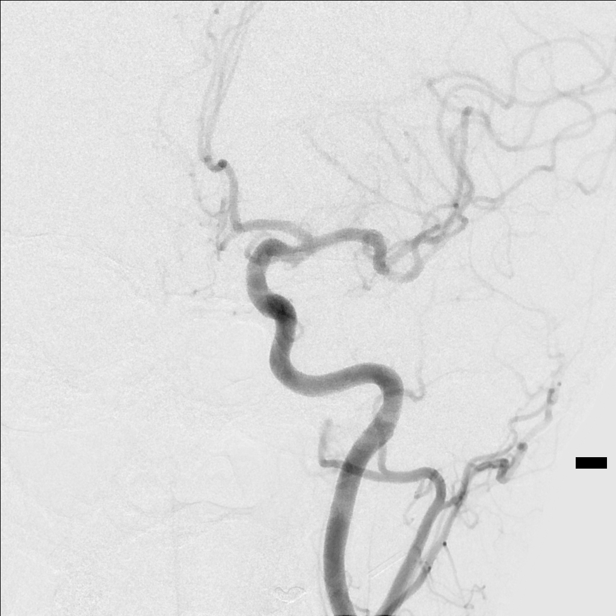
[im 109/157]
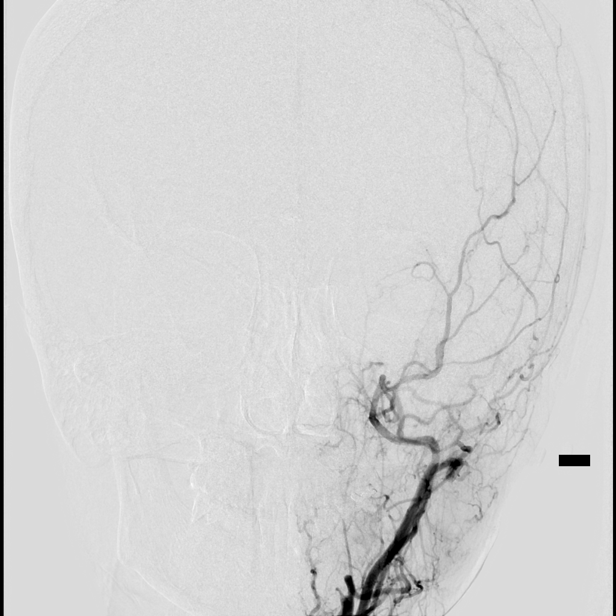
[im 123/157]
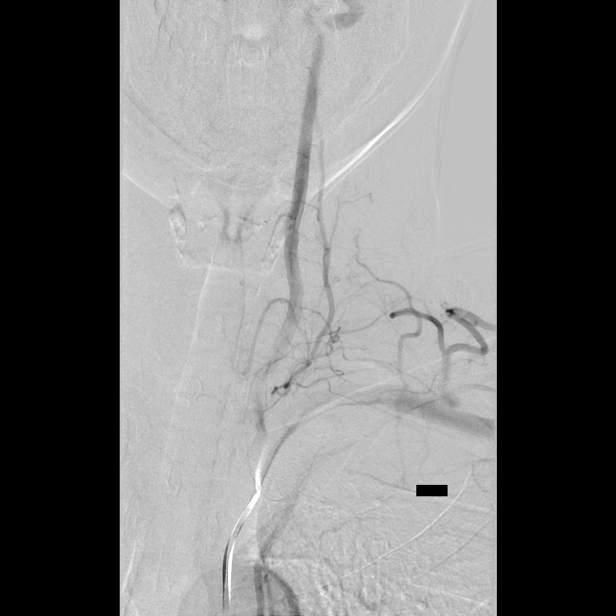
[im 136/157]
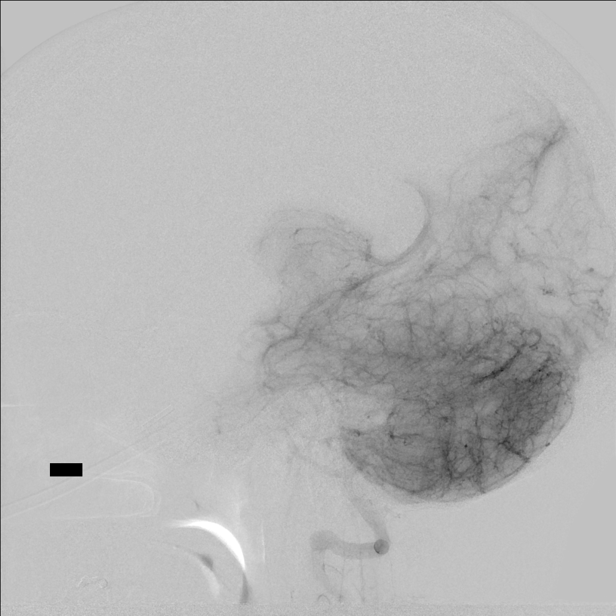
[im 150/157]
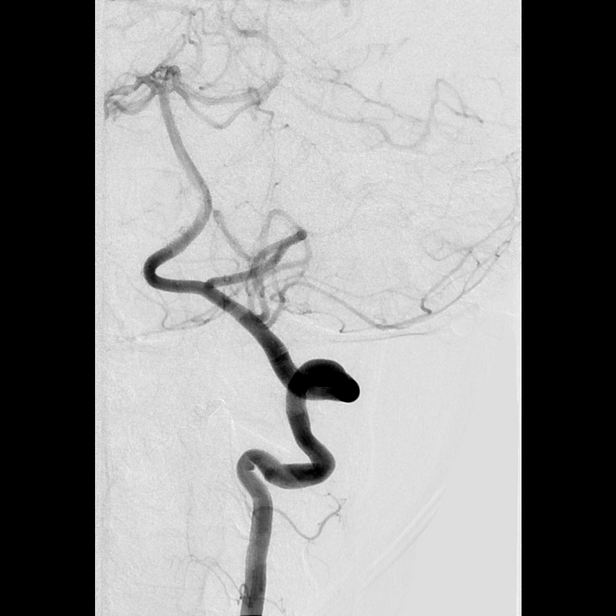

[11 of 24 positions shown; findings below may reference images not displayed]

EXAM:
BILATERAL COMMON CAROTID AND INNOMINATE ANGIOGRAPHY AND BILATERAL
VERTEBRAL ARTERY ANGIOGRAMS:

MEDICATIONS:
Versed 4 mg IV. Fentanyl 100 mcg IV.

ANESTHESIA/SEDATION:
Conscious sedation.

CONTRAST:  130mL OMNIPAQUE IOHEXOL 300 MG/ML SOLN, 21mL VISIPAQUE
IODIXANOL 320 MG/ML IV SOLN

PROCEDURE:
Following a full explanation of the procedure along with the
potential associated complications, an informed witnessed consent
was obtained.

The right groin was prepped and draped in the usual sterile fashion.
Thereafter using a modified Seldinger technique, transfemoral access
into the right common femoral artery was obtained without
difficulty. Over a 0.035-inch guidewire, a 5 French Pinnacle sheath
was inserted. Through this and also over a 0.035-inch guidewire, a 5
French JB1 catheter was advanced to the aortic arch region and
selectively positioned in the right common carotid artery, the right
vertebral artery, the left common carotid artery, the left external
carotid artery and left vertebral artery.

Also performed was a 3D rotational arteriogram centered over the
left anterior circulation followed by reconstruction on a separate
workstation.

There were no acute complications. The patient tolerated the
procedure well.

COMPLICATIONS:
None immediate.
FINDINGS: The right common carotid arteriogram demonstrates the right external
carotid artery and its major branches to be normal.

The right internal carotid artery at the bulb to the cranial skull
base also opacifies normally.

The petrous, cavernous and supraclinoid segments are widely patent.

The right posterior communicating artery opacification is seen
opacifying the right posterior cerebral artery distribution.

The right middle cerebral artery and the right anterior cerebral
artery opacify normally into the capillary and venous phases. The
venous phase also demonstrates the presence of a prominent inferior
petrosal sinus on the right draining the ipsilateral cavernous sinus
and the sphenoid parietal sinus.

The right vertebral artery origin is normal.

The vessel opacifies normally to the cranial skull base. There is
normal opacification of the right posterior-inferior cerebellar
artery and the right vertebrobasilar junction.

A right posterior-inferior cerebellar artery/posterior inferior
cerebellar artery complex is seen. The basilar artery, the left
posterior cerebral artery, the superior cerebellar arteries and the
left anterior-inferior cerebellar arteries opacifies normally into
the capillary and venous phases. Non-visualization of the right
posterior cerebral artery is probably related to in flow from the
anterior circulation.

The left common carotid arteriogram demonstrates the left external
carotid artery and its major branches to be normal.

The left internal carotid artery at the bulb to the cranial skull
base opacifies normally.

The petrous, cavernous and supraclinoid segments are widely patent.

The left middle and the left anterior cerebral arteries opacify
normally into the capillary and venous phases. Cross opacification
via the anterior communicating artery of the right anterior cerebral
artery A1 segment is seen. Prominent origins of the left anterior
carotid artery and left posterior cerebral artery are seen.

The venous phase also demonstrates hypoplasia of the left transverse
sinus.

A prominent vein of the base is seen projecting into the left
transverse sinus and subsequently the sigmoid sinus. The 3D
rotational arteriogram of the left anterior circulation with
subsequent reconstruction again demonstrates the left internal
carotid artery and the petrous cavernous and supraclinoid segments
to be devoid of any saccular outpouchings.

The left external carotid arteriogram demonstrates normal
opacification of the left external carotid artery branches. There is
no early shunting of blood seen into the venous circulation intra or
extracranially.

The left vertebral artery origin is normal.

The vessel opacifies normally through the level of C2.

The 1st horizontal segment demonstrates fusiform dilatation which
measures 7.4 mm x 4.2 mm and is associated with a proximal stenosis
of approximately 50%. Also associated with this is a small saccular
outpouching from the lateral aspect of the left vertebral artery
which measures approximately 1.2 mm.

However, distal to the junction of the 2nd vertical segment of the
left vertebral artery demonstrated is a fusiform aneurysmal
dilatation. This measures approximately 6.5mm x 4.5 mm.

Proximal to the 1st fusiform aneurysm is a focal narrowing of
approximately 50% stenosis with a saccular outpouching measuring
approximately 1.2 mm x 1.1 mm probably representing a small saccular
aneurysm.

Distal to this the left vertebrobasilar junction opacifies normally.
Normal opacification is seen of the left posterior inferior
cerebellar artery and the left vertebrobasilar junction.

The basilar artery, the posterior cerebral arteries, the superior
cerebellar arteries and the anterior inferior cerebellar arteries
opacify normally into the capillary and venous phases.

There were no acute complications. The patient tolerated the
procedure well.
IMPRESSION: No evidence of intracranial aneurysm involving the left internal
carotid artery on the 3D rotational reconstruction arteriogram.

Presence of a fusiform aneurysm measuring 7.4 mm x 4.2 mm, and
distal to this a 2nd fusiform aneurysm measuring 6.5 mm x 4.5 mm.
Associated smaller saccular aneurysm measuring approximately 1.2 mm
at the proximal aspect of the proximal fusiform aneurysm associated
with a 50% stenosis. These findings are highly suggestive of a
nonflow limiting healing dissection.

No evidence of intraluminal filling defects is seen.

The angiographic findings were reviewed with the patient and the
patient's husband.

She was advised to start one baby aspirin per day. She was also
advised to refrain from indulging in activities involving rotational
and hyperextension and hyperflexion movements of the cervical spine.

A follow-up CT angiogram of the head and neck will be undertaken in
approximately 3 months' time. She has been advised to call for any
concerns or questions that she might have. A follow-up appointment
subsequent to the CT angiogram will be scheduled.

## 2014-09-04 ENCOUNTER — Encounter: Payer: Self-pay | Admitting: Nurse Practitioner

## 2014-09-04 ENCOUNTER — Ambulatory Visit (INDEPENDENT_AMBULATORY_CARE_PROVIDER_SITE_OTHER): Payer: BLUE CROSS/BLUE SHIELD | Admitting: Nurse Practitioner

## 2014-09-04 VITALS — BP 100/64 | HR 68 | Ht <= 58 in | Wt 89.0 lb

## 2014-09-04 DIAGNOSIS — Z01419 Encounter for gynecological examination (general) (routine) without abnormal findings: Secondary | ICD-10-CM | POA: Diagnosis not present

## 2014-09-04 DIAGNOSIS — Z0000 Encounter for general adult medical examination without abnormal findings: Secondary | ICD-10-CM | POA: Diagnosis not present

## 2014-09-04 DIAGNOSIS — Z23 Encounter for immunization: Secondary | ICD-10-CM | POA: Diagnosis not present

## 2014-09-04 MED ORDER — ESTRADIOL 2 MG VA RING
2.0000 mg | VAGINAL_RING | VAGINAL | Status: DC
Start: 2014-09-04 — End: 2015-09-24

## 2014-09-04 MED ORDER — ESTROGENS, CONJUGATED 0.625 MG/GM VA CREA: TOPICAL_CREAM | VAGINAL | Status: DC

## 2014-09-04 NOTE — Patient Instructions (Signed)

## 2014-09-04 NOTE — Progress Notes (Signed)
Patient ID: Judy Middleton, female   DOB: 1960-12-25, 54 y.o.   MRN: [id number] 54 y.o. [id number] Married  Caucasian Fe here for annual exam.  No vaso symptoms.  Still some vaginal dryness but improved with vaginal estrogen. Using coconut oil prn.  Patient's last menstrual period was 01/07/2012.          Sexually active: Yes.    The current method of family planning is post menopausal status.    Exercising: Yes.    walking 2-3 miles everyday, plays tennis 1-2 times per week, goes to gym 1-2 times per week, yoga 1-2 times per week, ride 1 mile everyday Smoker:  no  Health Maintenance: Pap: 08/29/13, negative with neg HR HPV, no history of abnormal MMG: 11/24/13, 3D, Bi-Rads 1:  Negative Colonoscopy: 02/2010, normal, repeat in 5 years due to family history of colon cancer BMD: 2011, normal per patient TDaP: 2007 Labs:  PCP   reports that she has never smoked. She has never used smokeless tobacco. She reports that she drinks about 1.8 oz of alcohol per week. She reports that she does not use illicit drugs.  Past Medical History  Diagnosis Date  . Cochlear hydrops     Past Surgical History  Procedure Laterality Date  . Cerebral angiogram  12/2012    Hydrops of the left ear. Vascular Neuro thought nothing acute.  CT angiogram 3/15 was stable.    Current Outpatient Prescriptions  Medication Sig Dispense Refill  . Calcium-Vitamin D-Vitamin K (VIACTIV) 500-500-40 MG-UNT-MCG CHEW Chew 1 tablet by mouth daily.    Marland Kitchen conjugated estrogens (PREMARIN) vaginal cream Use as directed to urethra twice weekly 42.5 g 3  . estradiol (ESTRING) 2 MG vaginal ring Place 2 mg vaginally every 3 (three) months. Insert a new ring into vagina every 3 months 3 each 4  . mometasone (NASONEX) 50 MCG/ACT nasal spray Place 2 sprays into both nostrils daily as needed (congestion/allergies).    . Multiple Vitamins-Minerals (MULTIPLE VITAMINS/WOMENS PO) Take 1 tablet by mouth daily.    . potassium chloride (K-DUR) 10  MEQ tablet Take 2 tablets by mouth daily.    Marland Kitchen Propylene Glycol (SYSTANE BALANCE) 0.6 % SOLN Place 1-2 drops into both eyes daily as needed (dryness).    . triamterene-hydrochlorothiazide (DYAZIDE) 37.5-25 MG per capsule Take 1 capsule by mouth daily.     No current facility-administered medications for this visit.    Family History  Problem Relation Age of Onset  . Osteoporosis Mother   . Celiac disease Mother   . Heart disease Maternal Grandmother   . Heart attack Maternal Grandmother   . Heart attack Maternal Grandfather   . Heart disease Maternal Grandfather   . Parkinson's disease Paternal Grandfather   . Colon cancer Paternal Grandfather     ROS:  Pertinent items are noted in HPI.  Otherwise, a comprehensive ROS was negative.  Exam:   BP 100/64 mmHg  Pulse 68  Ht 4' 9.5" (1.461 m)  Wt 89 lb (40.37 kg)  BMI 18.91 kg/m2  LMP 01/07/2012 Height: 4' 9.5" (146.1 cm) Ht Readings from Last 3 Encounters:  09/04/14 4' 9.5" (1.461 m)  11/25/13 4' 9.5" (1.461 m)  08/29/13 4' 9.5" (1.461 m)    General appearance: alert, cooperative and appears stated age Head: Normocephalic, without obvious abnormality, atraumatic Neck: no adenopathy, supple, symmetrical, trachea midline and thyroid normal to inspection and palpation Lungs: clear to auscultation bilaterally Breasts: normal appearance, no masses or tenderness Heart: regular rate and rhythm  Abdomen: soft, non-tender; no masses,  no organomegaly Extremities: extremities normal, atraumatic, no cyanosis or edema Skin: Skin color, texture, turgor normal. No rashes or lesions Lymph nodes: Cervical, supraclavicular, and axillary nodes normal. No abnormal inguinal nodes palpated Neurologic: Grossly normal   Pelvic: External genitalia:  no lesions              Urethra:  normal appearing urethra with no masses, tenderness or lesions              Bartholin's and Skene's: normal                 Vagina: normal appearing vagina with  normal color and discharge, no lesions              Cervix: anteverted              Pap taken: No. Bimanual Exam:  Uterus:  normal size, contour, position, consistency, mobility, non-tender              Adnexa: no mass, fullness, tenderness               Rectovaginal: Confirms               Anus:  normal sphincter tone, no lesions  Chaperone present: no  A:  Well Woman with normal exam   Postmenopausal no HRT Atrophic vaginitis - on vaginal estrogen FMH of osteoporosis and colon cancer History of Cochlear Hydrops - takes Dyazide for this  Update  immunization  P:   Reviewed health and wellness pertinent to exam  Pap smear as above  Mammogram is due 11/2014  Refill on Estring to use every 3 months  Refill on Premarin Vaginal cream to use at the urethra prn  Counseled with risk of CVA, DVT, cancer, etc.  Update TDaP today  Counseled on breast self exam, mammography screening, use and side effects of HRT, adequate intake of calcium and vitamin D, diet and exercise return annually or prn  An After Visit Summary was printed and given to the patient.

## 2014-09-06 NOTE — Progress Notes (Signed)
Encounter reviewed by Dr. Brook Amundson C. Silva.  

## 2014-11-21 ENCOUNTER — Telehealth (HOSPITAL_COMMUNITY): Payer: Self-pay

## 2014-11-21 NOTE — Telephone Encounter (Signed)
Called to schedule f/u MRI/MRA, pt stated that she did not want to do f/u exam. Pt was told to call if there were any changes. AW

## 2015-09-04 ENCOUNTER — Ambulatory Visit: Payer: BC Managed Care – PPO | Admitting: Nurse Practitioner

## 2015-09-05 ENCOUNTER — Ambulatory Visit: Payer: BLUE CROSS/BLUE SHIELD | Admitting: Nurse Practitioner

## 2015-09-24 ENCOUNTER — Ambulatory Visit (INDEPENDENT_AMBULATORY_CARE_PROVIDER_SITE_OTHER): Payer: BLUE CROSS/BLUE SHIELD | Admitting: Nurse Practitioner

## 2015-09-24 ENCOUNTER — Encounter: Payer: Self-pay | Admitting: Nurse Practitioner

## 2015-09-24 VITALS — BP 100/64 | HR 60 | Ht <= 58 in | Wt 90.0 lb

## 2015-09-24 DIAGNOSIS — Z0000 Encounter for general adult medical examination without abnormal findings: Secondary | ICD-10-CM

## 2015-09-24 DIAGNOSIS — Z01419 Encounter for gynecological examination (general) (routine) without abnormal findings: Secondary | ICD-10-CM

## 2015-09-24 DIAGNOSIS — N952 Postmenopausal atrophic vaginitis: Secondary | ICD-10-CM

## 2015-09-24 MED ORDER — ESTRADIOL 2 MG VA RING
2.0000 mg | VAGINAL_RING | VAGINAL | 4 refills | Status: DC
Start: 2015-09-24 — End: 2016-09-30

## 2015-09-24 MED ORDER — ESTROGENS, CONJUGATED 0.625 MG/GM VA CREA: TOPICAL_CREAM | VAGINAL | 3 refills | Status: DC

## 2015-09-24 MED ORDER — ESTRADIOL 2 MG VA RING: 2.0000 mg | VAGINAL_RING | VAGINAL | 4 refills | Status: DC

## 2015-09-24 NOTE — Progress Notes (Signed)
Patient ID: Judy Middleton, female   DOB: March 14, 1960, 55 y.o.   MRN: [id number]  55 y.o. 862P2002 Married  Caucasian Fe here for annual exam.  No new problems.  She just returned from a trip to [place].  Patient's last menstrual period was 01/07/2012.          Sexually active: Yes.    The current method of family planning is post menopausal status.    Exercising: Yes. Walking 2-3 miles everyday, plays tennis 1-2 times per week, goes to gym 1-2 times per week, yoga 1-2 times per week, run 1 mile everyday Smoker:  no  Health Maintenance: Pap: 08/29/13, negative with neg HR HPV MMG:01/29/15, 3D, Bi-Rads 1:  Negative Colonoscopy:02/2010, normal, repeat in 5 years due to family history of colon cancer BMD:2011, normal per patient TDaP:09/04/14 Pneumonia: Not indicated due to age Hep C: 2010, negative  HIV: pregnancy in 1995 Labs: PCP takes care of all labs   reports that she has never smoked. She has never used smokeless tobacco. She reports that she drinks about 1.8 oz of alcohol per week . She reports that she does not use drugs.  Past Medical History:  Diagnosis Date  . Cochlear hydrops     Past Surgical History:  Procedure Laterality Date  . CEREBRAL ANGIOGRAM  12/2012   Hydrops of the left ear. Vascular Neuro thought nothing acute.  CT angiogram 3/15 was stable.    Current Outpatient Prescriptions  Medication Sig Dispense Refill  . Calcium-Vitamin D-Vitamin K (VIACTIV) 500-500-40 MG-UNT-MCG CHEW Chew 1 tablet by mouth daily.    Marland Kitchen. conjugated estrogens (PREMARIN) vaginal cream Use as directed to urethra twice weekly 42.5 g 3  . estradiol (ESTRING) 2 MG vaginal ring Place 2 mg vaginally every 3 (three) months. Insert a new ring into vagina every 3 months 3 each 4  . mometasone (NASONEX) 50 MCG/ACT nasal spray Place 2 sprays into both nostrils daily as needed (congestion/allergies).    . Multiple Vitamins-Minerals (MULTIPLE VITAMINS/WOMENS PO) Take 1 tablet by mouth daily.    .  potassium chloride (K-DUR) 10 MEQ tablet Take 2 tablets by mouth daily.    Marland Kitchen. Propylene Glycol (SYSTANE BALANCE) 0.6 % SOLN Place 1-2 drops into both eyes daily as needed (dryness).    . triamterene-hydrochlorothiazide (DYAZIDE) 37.5-25 MG per capsule Take 1 capsule by mouth daily.     No current facility-administered medications for this visit.     Family History  Problem Relation Age of Onset  . Osteoporosis Mother   . Celiac disease Mother   . Heart disease Maternal Grandmother   . Heart attack Maternal Grandmother   . Heart attack Maternal Grandfather   . Heart disease Maternal Grandfather   . Parkinson's disease Paternal Grandfather   . Colon cancer Paternal Grandfather     ROS:  Pertinent items are noted in HPI.  Otherwise, a comprehensive ROS was negative.  Exam:   LMP 01/07/2012    Ht Readings from Last 3 Encounters:  09/04/14 4' 9.5" (1.461 m)  11/25/13 4' 9.5" (1.461 m)  08/29/13 4' 9.5" (1.461 m)    General appearance: alert, cooperative and appears stated age Head: Normocephalic, without obvious abnormality, atraumatic Neck: no adenopathy, supple, symmetrical, trachea midline and thyroid normal to inspection and palpation Lungs: clear to auscultation bilaterally Breasts: normal appearance, no masses or tenderness Heart: regular rate and rhythm Abdomen: soft, non-tender; no masses,  no organomegaly Extremities: extremities normal, atraumatic, no cyanosis or edema Skin: Skin color, texture, turgor  normal. No rashes or lesions Lymph nodes: Cervical, supraclavicular, and axillary nodes normal. No abnormal inguinal nodes palpated Neurologic: Grossly normal   Pelvic: External genitalia:  no lesions              Urethra:  normal appearing urethra with no masses, tenderness or lesions              Bartholin's and Skene's: normal                 Vagina: normal appearing vagina with normal color and discharge, no lesions              Cervix: anteverted               Pap taken: No. 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  Counseled with risk of CVA, DVT, cancer, etc.  Counseled on breast self exam, mammography screening, use and side effects of HRT, adequate intake of calcium and vitamin D, diet and exercise, Kegel's exercises return annually or prn  An After Visit Summary was printed and given to the patient.

## 2015-09-24 NOTE — Patient Instructions (Signed)

## 2015-09-28 NOTE — Progress Notes (Signed)
Encounter reviewed by Dr. Aubria Vanecek Amundson C. Silva.  

## 2015-11-13 DIAGNOSIS — M722 Plantar fascial fibromatosis: Secondary | ICD-10-CM | POA: Diagnosis not present

## 2015-11-14 DIAGNOSIS — H8102 Meniere's disease, left ear: Secondary | ICD-10-CM | POA: Diagnosis not present

## 2015-11-14 DIAGNOSIS — H903 Sensorineural hearing loss, bilateral: Secondary | ICD-10-CM | POA: Diagnosis not present

## 2015-11-14 DIAGNOSIS — H9312 Tinnitus, left ear: Secondary | ICD-10-CM | POA: Diagnosis not present

## 2015-11-14 DIAGNOSIS — I6502 Occlusion and stenosis of left vertebral artery: Secondary | ICD-10-CM | POA: Diagnosis not present

## 2016-02-13 DIAGNOSIS — D2271 Melanocytic nevi of right lower limb, including hip: Secondary | ICD-10-CM | POA: Diagnosis not present

## 2016-02-13 DIAGNOSIS — L570 Actinic keratosis: Secondary | ICD-10-CM | POA: Diagnosis not present

## 2016-02-13 DIAGNOSIS — D225 Melanocytic nevi of trunk: Secondary | ICD-10-CM | POA: Diagnosis not present

## 2016-02-13 DIAGNOSIS — D2262 Melanocytic nevi of left upper limb, including shoulder: Secondary | ICD-10-CM | POA: Diagnosis not present

## 2016-02-13 DIAGNOSIS — L821 Other seborrheic keratosis: Secondary | ICD-10-CM | POA: Diagnosis not present

## 2016-02-18 DIAGNOSIS — Z1231 Encounter for screening mammogram for malignant neoplasm of breast: Secondary | ICD-10-CM | POA: Diagnosis not present

## 2016-04-02 DIAGNOSIS — H5310 Unspecified subjective visual disturbances: Secondary | ICD-10-CM | POA: Diagnosis not present

## 2016-04-02 DIAGNOSIS — G43909 Migraine, unspecified, not intractable, without status migrainosus: Secondary | ICD-10-CM | POA: Diagnosis not present

## 2016-04-02 DIAGNOSIS — H43812 Vitreous degeneration, left eye: Secondary | ICD-10-CM | POA: Diagnosis not present

## 2016-07-14 DIAGNOSIS — H04123 Dry eye syndrome of bilateral lacrimal glands: Secondary | ICD-10-CM | POA: Diagnosis not present

## 2016-07-14 DIAGNOSIS — H43812 Vitreous degeneration, left eye: Secondary | ICD-10-CM | POA: Diagnosis not present

## 2016-07-14 DIAGNOSIS — H5213 Myopia, bilateral: Secondary | ICD-10-CM | POA: Diagnosis not present

## 2016-07-22 ENCOUNTER — Telehealth: Payer: Self-pay | Admitting: Certified Nurse Midwife

## 2016-07-22 NOTE — Telephone Encounter (Signed)
LMTCB/:NP/ .CX/LETTER SENT/RD ° °

## 2016-09-25 DIAGNOSIS — H9312 Tinnitus, left ear: Secondary | ICD-10-CM | POA: Diagnosis not present

## 2016-09-25 DIAGNOSIS — I6502 Occlusion and stenosis of left vertebral artery: Secondary | ICD-10-CM | POA: Diagnosis not present

## 2016-09-25 DIAGNOSIS — H8102 Meniere's disease, left ear: Secondary | ICD-10-CM | POA: Diagnosis not present

## 2016-09-25 DIAGNOSIS — H903 Sensorineural hearing loss, bilateral: Secondary | ICD-10-CM | POA: Diagnosis not present

## 2016-09-29 ENCOUNTER — Ambulatory Visit: Payer: BLUE CROSS/BLUE SHIELD | Admitting: Nurse Practitioner

## 2016-09-30 ENCOUNTER — Other Ambulatory Visit (HOSPITAL_COMMUNITY)
Admission: RE | Admit: 2016-09-30 | Discharge: 2016-09-30 | Disposition: A | Discharge status: Home / Self Care | Payer: BLUE CROSS/BLUE SHIELD | Source: Ambulatory Visit | Attending: Obstetrics & Gynecology | Admitting: Obstetrics & Gynecology

## 2016-09-30 ENCOUNTER — Encounter: Payer: Self-pay | Admitting: Certified Nurse Midwife

## 2016-09-30 ENCOUNTER — Ambulatory Visit: Payer: BLUE CROSS/BLUE SHIELD | Admitting: Certified Nurse Midwife

## 2016-09-30 VITALS — BP 98/62 | HR 60 | Resp 16 | Ht <= 58 in | Wt 90.0 lb

## 2016-09-30 DIAGNOSIS — N952 Postmenopausal atrophic vaginitis: Secondary | ICD-10-CM | POA: Diagnosis not present

## 2016-09-30 DIAGNOSIS — Z124 Encounter for screening for malignant neoplasm of cervix: Secondary | ICD-10-CM | POA: Insufficient documentation

## 2016-09-30 DIAGNOSIS — Z01419 Encounter for gynecological examination (general) (routine) without abnormal findings: Secondary | ICD-10-CM

## 2016-09-30 DIAGNOSIS — N951 Menopausal and female climacteric states: Secondary | ICD-10-CM | POA: Diagnosis not present

## 2016-09-30 MED ORDER — ESTROGENS, CONJUGATED 0.625 MG/GM VA CREA: TOPICAL_CREAM | VAGINAL | 1 refills | Status: DC

## 2016-09-30 MED ORDER — ESTROGENS, CONJUGATED 0.625 MG/GM VA CREA
TOPICAL_CREAM | VAGINAL | 3 refills | Status: DC
Start: 2016-09-30 — End: 2016-09-30

## 2016-09-30 MED ORDER — ESTRADIOL 2 MG VA RING: 2.0000 mg | VAGINAL_RING | VAGINAL | 4 refills | Status: DC

## 2016-09-30 NOTE — Progress Notes (Signed)
56 y.o. G61P2002 Married  Caucasian Fe here for annual exam.  Menopausal no HRT. Using Estring for atrophic vaginitis and using Premarin Cream twice weekly around urethral meatus to prevent UTI symptoms. Working well. Denies vaginal bleeding. Sees PCP yearly for labs. Exercises to feel fit. No health issues today.  Patient's last menstrual period was 01/07/2012.          Sexually active: Yes.    The current method of family planning is post menopausal status.    Exercising: Yes.    walk, yoga, run, tennis, weights Smoker:  no  Health Maintenance: Pap:  08-29-13 neg HPV HR neg History of Abnormal Pap: no MMG:  01-29-15 category d density birads 1:neg  Self Breast exams: no Colonoscopy:  2012 f/u 33yrs family hx BMD:   2011 TDaP:  2016 Shingles: 2018 Pneumonia: not done Hep C and HIV: both neg in the past Labs: no   reports that she has never smoked. She has never used smokeless tobacco. She reports that she drinks about 1.2 - 1.8 oz of alcohol per week . She reports that she does not use drugs.  Past Medical History:  Diagnosis Date  . Cochlear hydrops     Past Surgical History:  Procedure Laterality Date  . CEREBRAL ANGIOGRAM  12/2012   Hydrops of the left ear. Vascular Neuro thought nothing acute.  CT angiogram 3/15 was stable.    Current Outpatient Prescriptions  Medication Sig Dispense Refill  . CALCIUM PO Take by mouth.    . conjugated estrogens (PREMARIN) vaginal cream Use as directed to urethra twice weekly 42.5 g 3  . estradiol (ESTRING) 2 MG vaginal ring Place 2 mg vaginally every 3 (three) months. Insert a new ring into vagina every 3 months 3 each 4  . mometasone (NASONEX) 50 MCG/ACT nasal spray Place 2 sprays into both nostrils daily as needed (congestion/allergies).    . Multiple Vitamins-Minerals (MULTIPLE VITAMINS/WOMENS PO) Take 1 tablet by mouth daily.    . potassium chloride (K-DUR) 10 MEQ tablet Take 2 tablets by mouth daily.    Marland Kitchen Propylene Glycol (SYSTANE  BALANCE) 0.6 % SOLN Place 1-2 drops into both eyes daily as needed (dryness).    . triamterene-hydrochlorothiazide (DYAZIDE) 37.5-25 MG per capsule Take 1 capsule by mouth daily.     No current facility-administered medications for this visit.     Family History  Problem Relation Age of Onset  . Osteoporosis Mother   . Celiac disease Mother   . Dementia Father 60  . Heart disease Maternal Grandmother   . Heart attack Maternal Grandmother   . Heart attack Maternal Grandfather   . Heart disease Maternal Grandfather   . Parkinson's disease Paternal Grandfather   . Colon cancer Paternal Grandfather   . Breast cancer Neg Hx   . Diabetes Neg Hx     ROS:  Pertinent items are noted in HPI.  Otherwise, a comprehensive ROS was negative.  Exam:   BP 98/62   Pulse 60   Resp 16   Ht 4' 9.25" (1.454 m)   Wt 90 lb (40.8 kg)   LMP 01/07/2012   BMI 19.31 kg/m  Height: 4' 9.25" (145.4 cm) Ht Readings from Last 3 Encounters:  09/30/16 4' 9.25" (1.454 m)  09/24/15  (1.448 m)  09/04/14 4' 9.5" (1.461 m)    General appearance: alert, cooperative and appears stated age Head: Normocephalic, without obvious abnormality, atraumatic Neck: no adenopathy, supple, symmetrical, trachea midline and thyroid normal to inspection  and palpation Lungs: clear to auscultation bilaterally Breasts: normal appearance, no masses or tenderness, No nipple retraction or dimpling, No nipple discharge or bleeding, No axillary or supraclavicular adenopathy Heart: regular rate and rhythm Abdomen: soft, non-tender; no masses,  no organomegaly Extremities: extremities normal, atraumatic, no cyanosis or edema Skin: Skin color, texture, turgor normal. No rashes or lesions Lymph nodes: Cervical, supraclavicular, and axillary nodes normal. No abnormal inguinal nodes palpated Neurologic: Grossly normal   Pelvic: External genitalia:  no lesions              Urethra:  normal appearing urethra with no masses,  tenderness or lesions              Bartholin's and Skene's: normal                 Vagina: normal appearing vagina with normal color and discharge, no lesions              Cervix: multiparous appearance, no bleeding following Pap, no cervical motion tenderness and no lesions              Pap taken: Yes.   Bimanual Exam:  Uterus:  normal size, contour, position, consistency, mobility, non-tender and anteverted              Adnexa: normal adnexa and no mass, fullness, tenderness               Rectovaginal: Confirms               Anus:  normal sphincter tone, no lesions  Chaperone present: yes  A:  Well Woman with normal exam  Menopausal no HRT  Atrophic vaginitis with good results with Estring use, requests continuance  Premarin cream use for UTI prevention only twice weekly prn requests continuance  P:   Reviewed health and wellness pertinent to exam  Discussed importance of notifying if vaginal bleeding  Discussed risks and benefits of Estring and Premarin cream use. Questions addressed. Stressed pea size amount of Premarin cream use only and no more than twice weekly. Patient voiced understanding.  Rx Estring see order with instructions  Rx Premarin see order with instructions  Pap smear: yes   counseled on breast self exam, mammography screening, feminine hygiene, adequate intake of calcium and vitamin D, diet and exercise, Kegel's exercises  return annually or prn  An After Visit Summary was printed and given to the patient.

## 2016-09-30 NOTE — Patient Instructions (Signed)

## 2016-10-02 LAB — CYTOLOGY - PAP: DIAGNOSIS: NEGATIVE

## 2017-02-24 DIAGNOSIS — Z1231 Encounter for screening mammogram for malignant neoplasm of breast: Secondary | ICD-10-CM | POA: Diagnosis not present

## 2017-06-29 DIAGNOSIS — H9312 Tinnitus, left ear: Secondary | ICD-10-CM | POA: Diagnosis not present

## 2017-06-29 DIAGNOSIS — H903 Sensorineural hearing loss, bilateral: Secondary | ICD-10-CM | POA: Diagnosis not present

## 2017-06-29 DIAGNOSIS — H8102 Meniere's disease, left ear: Secondary | ICD-10-CM | POA: Diagnosis not present

## 2017-06-29 DIAGNOSIS — I6502 Occlusion and stenosis of left vertebral artery: Secondary | ICD-10-CM | POA: Diagnosis not present

## 2017-10-06 ENCOUNTER — Other Ambulatory Visit: Payer: Self-pay

## 2017-10-06 ENCOUNTER — Encounter: Payer: Self-pay | Admitting: Certified Nurse Midwife

## 2017-10-06 ENCOUNTER — Ambulatory Visit: Payer: BLUE CROSS/BLUE SHIELD | Admitting: Certified Nurse Midwife

## 2017-10-06 VITALS — BP 110/72 | HR 68 | Ht <= 58 in | Wt 89.0 lb

## 2017-10-06 DIAGNOSIS — N951 Menopausal and female climacteric states: Secondary | ICD-10-CM

## 2017-10-06 DIAGNOSIS — Z01419 Encounter for gynecological examination (general) (routine) without abnormal findings: Secondary | ICD-10-CM

## 2017-10-06 DIAGNOSIS — N952 Postmenopausal atrophic vaginitis: Secondary | ICD-10-CM

## 2017-10-06 NOTE — Progress Notes (Signed)
57 y.o. G6P2002 Married  Caucasian Fe here for annual exam.  Menopausal no HRT. Denies vaginal bleeding. Still continues to have vaginal dryness. Sees Severiano Gilbert PCP yearly for labs. Continues with Estring use for atrophy and Premarin cream as needed for external use. Would like to use something else for external dryness. Social stress with father in skilled nursing due to hip fracture. Has friends and family support.  No other health issues today.  Patient's last menstrual period was 01/07/2012.          Sexually active: Yes.    The current method of family planning is post menopausal status.    Exercising: Yes.    walking, running, tennis, yoga Smoker:  no  Review of Systems  Constitutional: Negative.   HENT: Negative.   Eyes: Negative.   Respiratory: Negative.   Cardiovascular: Negative.   Gastrointestinal: Negative.   Genitourinary:       Pain or bleeding with intercourse  Musculoskeletal: Negative.   Skin: Negative.   Neurological: Negative.   Endo/Heme/Allergies: Negative.   Psychiatric/Behavioral: Negative.     Health Maintenance: Pap:  08-29-13 neg HPV HR neg, 09-30-16 neg History of Abnormal Pap: no MMG:  2018 needs to sign release Self Breast exams: no Colonoscopy:  2012 f/u 19yrs (family hx) BMD:   2011 TDaP:  2016 Shingles: 2018 Pneumonia: not done Hep C and HIV: per patient both neg Labs: no   reports that she has never smoked. She has never used smokeless tobacco. She reports that she drinks about 3.0 standard drinks of alcohol per week. She reports that she does not use drugs.  Past Medical History:  Diagnosis Date  . Cochlear hydrops     Past Surgical History:  Procedure Laterality Date  . CEREBRAL ANGIOGRAM  12/2012   Hydrops of the left ear. Vascular Neuro thought nothing acute.  CT angiogram 3/15 was stable.    Current Outpatient Medications  Medication Sig Dispense Refill  . CALCIUM PO Take by mouth.    . conjugated estrogens (PREMARIN) vaginal  cream Use as directed to urethra twice weekly pea size amount only 42.5 g 1  . estradiol (ESTRING) 2 MG vaginal ring Place 2 mg vaginally every 3 (three) months. Insert a new ring into vagina every 3 months 3 each 4  . mometasone (NASONEX) 50 MCG/ACT nasal spray Place 2 sprays into both nostrils daily as needed (congestion/allergies).    . Multiple Vitamins-Minerals (MULTIPLE VITAMINS/WOMENS PO) Take 1 tablet by mouth daily.    . potassium chloride (K-DUR) 10 MEQ tablet Take 2 tablets by mouth daily.    Marland Kitchen Propylene Glycol (SYSTANE BALANCE) 0.6 % SOLN Place 1-2 drops into both eyes daily as needed (dryness).    . triamterene-hydrochlorothiazide (DYAZIDE) 37.5-25 MG per capsule Take 1 capsule by mouth daily.     No current facility-administered medications for this visit.     Family History  Problem Relation Age of Onset  . Osteoporosis Mother   . Celiac disease Mother   . Dementia Father 89  . Heart disease Maternal Grandmother   . Heart attack Maternal Grandmother   . Heart attack Maternal Grandfather   . Heart disease Maternal Grandfather   . Parkinson's disease Paternal Grandfather   . Colon cancer Paternal Grandfather     ROS:  Pertinent items are noted in HPI.  Otherwise, a comprehensive ROS was negative.  Exam:   Ht 4' 9.25" (1.454 m)   Wt 89 lb (40.4 kg)   LMP 01/07/2012  BMI 19.09 kg/m  Height: 4' 9.25" (145.4 cm) Ht Readings from Last 3 Encounters:  10/06/17 4' 9.25" (1.454 m)  09/30/16 4' 9.25" (1.454 m)  09/24/15 4\' 9"  (1.448 m)    General appearance: alert, cooperative and appears stated age Head: Normocephalic, without obvious abnormality, atraumatic Neck: no adenopathy, supple, symmetrical, trachea midline and thyroid normal to inspection and palpation Lungs: clear to auscultation bilaterally Breasts: normal appearance, no masses or tenderness, No nipple retraction or dimpling, No nipple discharge or bleeding, No axillary or supraclavicular adenopathy Heart:  regular rate and rhythm Abdomen: soft, non-tender; no masses,  no organomegaly Extremities: extremities normal, atraumatic, no cyanosis or edema Skin: Skin color, texture, turgor normal. No rashes or lesions Lymph nodes: Cervical, supraclavicular, and axillary nodes normal. No abnormal inguinal nodes palpated Neurologic: Grossly normal   Pelvic: External genitalia:  no lesions, normal female with dryness noted at entrance of vagina and introital area              Urethra:  normal appearing urethra with no masses, tenderness or lesions              Bartholin's and Skene's: normal                 Vagina: normal appearing vagina with normal color and discharge, no lesions              Cervix: no cervical motion tenderness and no lesions normal appearance              Pap taken: No. Bimanual Exam:  Uterus:  normal size, contour, position, consistency, mobility, non-tender              Adnexa: normal adnexa and no mass, fullness, tenderness               Rectovaginal: Confirms               Anus:  normal sphincter tone, no lesions  Chaperone present: yes  A:  Well Woman with normal exam  Menopausal on HRT for symptom relief for symptom relief. Estring working well  External vaginal dryness  Hypertension management with PCP  Social stress with father's injury  P:   Reviewed health and wellness pertinent to exam  Aware of need to advise if vaginal bleeding.  Discussed risks/benefit of Estring use, desires continuance.  Rx Estring see order with instructions  Discussed coconut oil use for external dryness instructions given. Will advise if no change.  Continue follow up with PCP as indicated  Continue to seek help and support as needed.  Pap smear: no   counseled on breast self exam, mammography screening, feminine hygiene, adequate intake of calcium and vitamin D, diet and exercise  return annually or prn  An After Visit Summary was printed and given to the patient.

## 2017-10-06 NOTE — Patient Instructions (Signed)

## 2017-10-08 DIAGNOSIS — M545 Low back pain: Secondary | ICD-10-CM | POA: Diagnosis not present

## 2017-10-08 DIAGNOSIS — M62830 Muscle spasm of back: Secondary | ICD-10-CM | POA: Diagnosis not present

## 2017-10-15 DIAGNOSIS — Z23 Encounter for immunization: Secondary | ICD-10-CM | POA: Diagnosis not present

## 2017-11-05 ENCOUNTER — Telehealth: Payer: Self-pay | Admitting: Certified Nurse Midwife

## 2017-11-05 ENCOUNTER — Other Ambulatory Visit: Payer: Self-pay | Admitting: Certified Nurse Midwife

## 2017-11-05 DIAGNOSIS — N952 Postmenopausal atrophic vaginitis: Secondary | ICD-10-CM

## 2017-11-05 DIAGNOSIS — R350 Frequency of micturition: Secondary | ICD-10-CM

## 2017-11-05 DIAGNOSIS — Z87440 Personal history of urinary (tract) infections: Secondary | ICD-10-CM

## 2017-11-05 MED ORDER — ESTRADIOL 2 MG VA RING: 2.0000 mg | VAGINAL_RING | VAGINAL | 4 refills | Status: DC

## 2017-11-05 MED ORDER — ESTROGENS, CONJUGATED 0.625 MG/GM VA CREA: TOPICAL_CREAM | VAGINAL | 1 refills | Status: AC

## 2017-11-05 NOTE — Telephone Encounter (Signed)
Spoke with patient. Last AEX 10/06/17. Patient request refill of Estring to [facility] and Premarin vaginal cream to [facility]. Advised I will review with Leota Sauers, CNM and return call once reviewed.   Leota Sauers, CNM -ok to send Rx for:  Estring 2mg  vaginal ring #3/3RF Premarin vaginal cream  #42.5g/1RF

## 2017-11-05 NOTE — Telephone Encounter (Signed)
Orders sent, mammogram not in when she was in

## 2017-11-05 NOTE — Telephone Encounter (Signed)
Patient left voicemail that two of her medications were supposed to be called into the pharmacy after her annual on 10/06/17. Patient stated that neither prescription was at either pharmacy.

## 2017-11-06 MED ORDER — ESTRADIOL 2 MG VA RING: 2.0000 mg | VAGINAL_RING | VAGINAL | 4 refills | Status: DC

## 2017-11-06 NOTE — Telephone Encounter (Signed)
Rx for Estring to [facility] cancelled, spoke with [place]. [facility], new order placed.   Left detailed message, ok per dpr. Advised of medication refills, return call to office if any additional questions.   Routing to provider for final review. Patient is agreeable to disposition. Will close encounter.

## 2017-11-17 DIAGNOSIS — L570 Actinic keratosis: Secondary | ICD-10-CM | POA: Diagnosis not present

## 2018-01-22 DIAGNOSIS — H52203 Unspecified astigmatism, bilateral: Secondary | ICD-10-CM | POA: Diagnosis not present

## 2018-01-22 DIAGNOSIS — H43812 Vitreous degeneration, left eye: Secondary | ICD-10-CM | POA: Diagnosis not present

## 2018-01-22 DIAGNOSIS — H5213 Myopia, bilateral: Secondary | ICD-10-CM | POA: Diagnosis not present

## 2018-01-22 DIAGNOSIS — H04123 Dry eye syndrome of bilateral lacrimal glands: Secondary | ICD-10-CM | POA: Diagnosis not present

## 2018-02-24 DIAGNOSIS — D225 Melanocytic nevi of trunk: Secondary | ICD-10-CM | POA: Diagnosis not present

## 2018-02-24 DIAGNOSIS — L821 Other seborrheic keratosis: Secondary | ICD-10-CM | POA: Diagnosis not present

## 2018-02-24 DIAGNOSIS — D2271 Melanocytic nevi of right lower limb, including hip: Secondary | ICD-10-CM | POA: Diagnosis not present

## 2018-02-24 DIAGNOSIS — L570 Actinic keratosis: Secondary | ICD-10-CM | POA: Diagnosis not present

## 2018-02-24 DIAGNOSIS — L814 Other melanin hyperpigmentation: Secondary | ICD-10-CM | POA: Diagnosis not present

## 2018-03-02 DIAGNOSIS — Z1231 Encounter for screening mammogram for malignant neoplasm of breast: Secondary | ICD-10-CM | POA: Diagnosis not present

## 2018-09-14 DIAGNOSIS — H918X2 Other specified hearing loss, left ear: Secondary | ICD-10-CM | POA: Diagnosis not present

## 2018-09-14 DIAGNOSIS — H8102 Meniere's disease, left ear: Secondary | ICD-10-CM | POA: Insufficient documentation

## 2018-09-14 DIAGNOSIS — H903 Sensorineural hearing loss, bilateral: Secondary | ICD-10-CM | POA: Diagnosis not present

## 2018-10-03 DIAGNOSIS — Z23 Encounter for immunization: Secondary | ICD-10-CM | POA: Diagnosis not present

## 2018-10-08 ENCOUNTER — Other Ambulatory Visit: Payer: Self-pay

## 2018-10-13 ENCOUNTER — Other Ambulatory Visit: Payer: Self-pay

## 2018-10-13 ENCOUNTER — Ambulatory Visit: Payer: BC Managed Care – PPO | Admitting: Certified Nurse Midwife

## 2018-10-13 ENCOUNTER — Encounter: Payer: Self-pay | Admitting: Certified Nurse Midwife

## 2018-10-13 ENCOUNTER — Other Ambulatory Visit (HOSPITAL_COMMUNITY)
Admission: RE | Admit: 2018-10-13 | Discharge: 2018-10-13 | Disposition: A | Discharge status: Home / Self Care | Payer: BC Managed Care – PPO | Source: Ambulatory Visit | Attending: Obstetrics & Gynecology | Admitting: Obstetrics & Gynecology

## 2018-10-13 VITALS — BP 104/68 | HR 64 | Temp 97.1°F | Resp 16 | Ht <= 58 in | Wt 88.0 lb

## 2018-10-13 DIAGNOSIS — Z01419 Encounter for gynecological examination (general) (routine) without abnormal findings: Secondary | ICD-10-CM

## 2018-10-13 DIAGNOSIS — Z124 Encounter for screening for malignant neoplasm of cervix: Secondary | ICD-10-CM

## 2018-10-13 DIAGNOSIS — N951 Menopausal and female climacteric states: Secondary | ICD-10-CM

## 2018-10-13 DIAGNOSIS — N952 Postmenopausal atrophic vaginitis: Secondary | ICD-10-CM

## 2018-10-13 MED ORDER — ESTRADIOL 2 MG VA RING: 2.0000 mg | VAGINAL_RING | VAGINAL | 4 refills | Status: DC

## 2018-10-13 NOTE — Patient Instructions (Signed)

## 2018-10-13 NOTE — Progress Notes (Signed)
58 y.o. G68P2002 Married  Caucasian Fe here for annual exam. Menopausal on Estring and using Premarin cream for dryness and comfort. Denies any warning signs with use. Sees PCP for aex, hypertension management, labs. Has to schedule for the year. No other health issues.  Patient's last menstrual period was 01/07/2012.          Sexually active: Yes.    The current method of family planning is post menopausal status.    Exercising: Yes.    running, tennis, yoga Smoker:  no  Review of Systems  Constitutional: Negative.   HENT: Negative.   Eyes: Negative.   Respiratory: Negative.   Cardiovascular: Negative.   Gastrointestinal: Negative.   Genitourinary: Negative.   Musculoskeletal: Negative.   Skin: Negative.   Neurological: Negative.   Endo/Heme/Allergies: Negative.   Psychiatric/Behavioral: Negative.     Health Maintenance: Pap:  08-29-13 neg HPV HR neg, 09-30-16 neg History of Abnormal Pap: no MMG:  02/2018 neg per patient Self Breast exams: no Colonoscopy:  2012 f/u 68yrs (family history), not done BMD:   2011 TDaP:  2016 Shingles: 2018 Pneumonia: not done Hep C and HIV: per patient both neg Labs: if needed.   reports that she has never smoked. She has never used smokeless tobacco. She reports current alcohol use of about 3.0 standard drinks of alcohol per week. She reports that she does not use drugs.  Past Medical History:  Diagnosis Date  . Cochlear hydrops     Past Surgical History:  Procedure Laterality Date  . CEREBRAL ANGIOGRAM  12/2012   Hydrops of the left ear. Vascular Neuro thought nothing acute.  CT angiogram 3/15 was stable.    Current Outpatient Medications  Medication Sig Dispense Refill  . CALCIUM PO Take by mouth.    . conjugated estrogens (PREMARIN) vaginal cream Apply pea size amount of cream around urethra only twice weekly 42.5 g 1  . estradiol (ESTRING) 2 MG vaginal ring Place 2 mg vaginally every 3 (three) months. Insert a new ring into vagina  every 3 months 3 each 4  . mometasone (NASONEX) 50 MCG/ACT nasal spray Place 2 sprays into both nostrils daily as needed (congestion/allergies).    . Multiple Vitamins-Minerals (MULTIPLE VITAMINS/WOMENS PO) Take 1 tablet by mouth daily.    Marland Kitchen Propylene Glycol (SYSTANE BALANCE) 0.6 % SOLN Place 1-2 drops into both eyes daily as needed (dryness).    . triamterene-hydrochlorothiazide (DYAZIDE) 37.5-25 MG per capsule Take 1 capsule by mouth daily.     No current facility-administered medications for this visit.     Family History  Problem Relation Age of Onset  . Osteoporosis Mother   . Celiac disease Mother   . Dementia Father 43  . Heart disease Maternal Grandmother   . Heart attack Maternal Grandmother   . Heart attack Maternal Grandfather   . Heart disease Maternal Grandfather   . Parkinson's disease Paternal Grandfather   . Colon cancer Paternal Grandfather     ROS:  Pertinent items are noted in HPI.  Otherwise, a comprehensive ROS was negative.  Exam:   BP 104/68   Pulse 64   Temp (!) 97.1 F (36.2 C) (Skin)   Resp 16   Ht 4' 9.25" (1.454 m)   Wt 88 lb (39.9 kg)   LMP 01/07/2012   BMI 18.88 kg/m  Height: 4' 9.25" (145.4 cm) Ht Readings from Last 3 Encounters:  10/13/18 4' 9.25" (1.454 m)  10/06/17 4' 9.25" (1.454 m)  09/30/16 4' 9.25" (1.454  m)    General appearance: alert, cooperative and appears stated age Head: Normocephalic, without obvious abnormality, atraumatic Neck: no adenopathy, supple, symmetrical, trachea midline and thyroid normal to inspection and palpation Lungs: clear to auscultation bilaterally Breasts: normal appearance, no masses or tenderness, No nipple retraction or dimpling, No nipple discharge or bleeding, No axillary or supraclavicular adenopathy Heart: regular rate and rhythm Abdomen: soft, non-tender; no masses,  no organomegaly Extremities: extremities normal, atraumatic, no cyanosis or edema Skin: Skin color, texture, turgor normal. No  rashes or lesions Lymph nodes: Cervical, supraclavicular, and axillary nodes normal. No abnormal inguinal nodes palpated Neurologic: Grossly normal   Pelvic: External genitalia:  no lesions              Urethra:  normal appearing urethra with no masses, tenderness or lesions              Bartholin's and Skene's: normal                 Vagina: normal appearing vagina with normal color and discharge, no lesions, Estring noted              Cervix: no cervical motion tenderness, no lesions and normal appearance              Pap taken: Yes.   Bimanual Exam:  Uterus:  normal size, contour, position, consistency, mobility, non-tender and anteverted              Adnexa: normal adnexa and no mass, fullness, tenderness               Rectovaginal: Confirms               Anus:  normal sphincter tone, no lesions  Chaperone present: yes  A:  Well Woman with normal exam  Menopausal using Estring for atrophic vaginitis, working well desires continuance  Dyazide use for Tinnitis with MD management     P:   Reviewed health and wellness pertinent to exam  Aware to advise if vaginal bleeding  Risks/benefits/warning signs with Estring reviewed.  Rx Estring see order with instruction  Pap smear: yes  counseled on breast self exam, mammography screening, feminine hygiene, adequate intake of calcium and vitamin D, diet and exercise, Kegel's exercises  return annually or prn  An After Visit Summary was printed and given to the patient.

## 2018-10-25 LAB — CYTOLOGY - PAP
Comment: NEGATIVE
Diagnosis: NEGATIVE
High risk HPV: NEGATIVE

## 2018-11-16 ENCOUNTER — Telehealth: Payer: Self-pay | Admitting: *Deleted

## 2018-11-16 NOTE — Telephone Encounter (Signed)
Letter recived from [facility] notifying provider Estring will no longer be covered under plan as of 01/07/19. May submit PA after 01/07/19.    Call to patient to advise. Patient has been using Estring since 08/2013 for Atrophic vaginitis. Also uses premarin vaginal cream to urethra for dryness. Has used vagifem in the past, not effective. Patient is going to review covered options with her insurance plan, will notify office how she wants to proceed. Patient aware to return call if any additional questions.   Copy of letter to scan.   Routing to provider for final review. Patient is agreeable to disposition. Will close encounter.

## 2018-12-08 ENCOUNTER — Telehealth: Payer: Self-pay | Admitting: *Deleted

## 2018-12-08 NOTE — Telephone Encounter (Signed)
Left message to call Sharee Pimple, RN at [facility].    Letter received from [facility]. Estring vaginal ring will no longer be covered by plan as of 01/07/2019. Covered alternatives are estradiol or Imvexxy. Can submit a PA, but will not be able to submit until after 01/07/2019.

## 2018-12-13 ENCOUNTER — Telehealth: Payer: Self-pay

## 2018-12-13 NOTE — Telephone Encounter (Signed)
Pt called office to let us know that she received a letter in mail that estradiol imvexxy will be covered through her insurance and wanted to know if same as Estring? Will discuss with provider and give pt a call back. Pt agreeable.

## 2018-12-13 NOTE — Telephone Encounter (Signed)
Georgia Lopes, RN  Registered Nurse    Telephone Encounter  Signed  Creation Time:  12/13/2018 12:03 PM          Signed        Pt called office to let us know that she received a letter in mail that estradiol imvexxy will be covered through her insurance and wanted to know if same as Estring? Will discuss with provider and give pt a call back. Pt agreeable.

## 2018-12-13 NOTE — Telephone Encounter (Signed)
Judy Middleton, CNM -would estradiol or imvexxy be an appropriate alternative to Estring?

## 2018-12-13 NOTE — Telephone Encounter (Signed)
See open telephone encounter dated 12/08/18.   Will close this encounter.

## 2018-12-13 NOTE — Telephone Encounter (Signed)
Spoke with patient.   Reviewed option of alternatives as seen below per Melvia Heaps, CNM.   Reviewed option of submitting PA for Estring after 01/07/2019. Patient states she has not used any alternatives. Is aware PA can take 5 days for response and approval not guaranteed, office notifies of response once received.  Also discussed Estring manufacture savings coupon or GoodRx.   Patient will return call after 01/07/2019 to advise office how she would like to proceed. Questions answered.   Copy of letter to scan.  Routing to provider for final review. Patient is agreeable to disposition. Will close encounter.

## 2018-12-13 NOTE — Telephone Encounter (Signed)
Left message to call Tashia Leiterman, RN at [facility] [phone].   

## 2018-12-13 NOTE — Telephone Encounter (Signed)
Vagifem twice weekly would be alternative, to try or Yuvafem.

## 2018-12-17 ENCOUNTER — Other Ambulatory Visit: Payer: Self-pay | Admitting: Certified Nurse Midwife

## 2018-12-17 MED ORDER — ESTRADIOL 10 MCG VA TABS: 1.0000 | ORAL_TABLET | VAGINAL | 3 refills | Status: AC

## 2018-12-17 NOTE — Telephone Encounter (Signed)
Spoke with patient. Estring will no longer be covered by plan as of 01/07/19. (see previous encounter dated 12/08/18)  Patient is requesting Rx for estradiol 10 mcg vaginal tab, 90 day supply, to [facility].   Advised patient I will send request to Melvia Heaps, CNM to review, f/u with pharmacy for filling. Patient verbalizes understanding.   Rx pended for estradiol 10 mcg vag tab, pv twice wkly. #24/3RF  Routing to Melvia Heaps, CNM

## 2018-12-17 NOTE — Telephone Encounter (Signed)
Rx sent. Patient notified.  ° °Encounter closed.  °

## 2018-12-17 NOTE — Telephone Encounter (Signed)
Patient says she would like to go with estradiol and would like a 90 day prescription sent to [facility].

## 2018-12-17 NOTE — Telephone Encounter (Signed)
Agreeable to plan.

## 2018-12-24 ENCOUNTER — Telehealth: Payer: Self-pay | Admitting: Certified Nurse Midwife

## 2018-12-24 NOTE — Telephone Encounter (Signed)
Left message to call Idan Prime, RN at [facility] [phone].   

## 2018-12-24 NOTE — Telephone Encounter (Signed)
[facility] representative left message on answering machine to speak with someone about patient's prescription for e-string. [place] ref number #[id number]

## 2018-12-24 NOTE — Telephone Encounter (Signed)
Spoke with Bonnita Nasuti, pharmacist at [facility].  See previous refill encounter dated 12/17/18. Rx cancelled for Estring vaginal ring. Will not be covered by plan as of 01/07/19.   Estradiol vag tab Rx filled and shipped to patient on 12/17/18.  Patient has also requested Estring to be filled. Pharmacy unable to fill Estring since new Rx was filled. Patient may contact customer service at [phone] to further discuss since [place] no longer covered by plan as of 01/07/19.

## 2018-12-27 NOTE — Telephone Encounter (Signed)
Patient is returning a call to Jill. °

## 2018-12-27 NOTE — Telephone Encounter (Signed)
Spoke with patient. Advised of conversation from [place]. Rx for Estring has been discontinued. Patient has already received RX for estradiol vag tabs. Patient will plan to switch to estradiol vag tabs as prescribed.   Routing to provider for final review. Patient is agreeable to disposition. Will close encounter.

## 2019-03-23 DIAGNOSIS — L814 Other melanin hyperpigmentation: Secondary | ICD-10-CM | POA: Diagnosis not present

## 2019-03-23 DIAGNOSIS — D2271 Melanocytic nevi of right lower limb, including hip: Secondary | ICD-10-CM | POA: Diagnosis not present

## 2019-03-23 DIAGNOSIS — D225 Melanocytic nevi of trunk: Secondary | ICD-10-CM | POA: Diagnosis not present

## 2019-03-23 DIAGNOSIS — L570 Actinic keratosis: Secondary | ICD-10-CM | POA: Diagnosis not present

## 2019-03-23 DIAGNOSIS — L821 Other seborrheic keratosis: Secondary | ICD-10-CM | POA: Diagnosis not present

## 2019-03-28 ENCOUNTER — Encounter: Payer: Self-pay | Admitting: Certified Nurse Midwife

## 2019-05-02 DIAGNOSIS — Z1231 Encounter for screening mammogram for malignant neoplasm of breast: Secondary | ICD-10-CM | POA: Diagnosis not present

## 2019-05-23 DIAGNOSIS — H5213 Myopia, bilateral: Secondary | ICD-10-CM | POA: Diagnosis not present

## 2019-05-23 DIAGNOSIS — H04123 Dry eye syndrome of bilateral lacrimal glands: Secondary | ICD-10-CM | POA: Diagnosis not present

## 2019-10-18 ENCOUNTER — Ambulatory Visit: Payer: BC Managed Care – PPO | Admitting: Certified Nurse Midwife

## 2019-12-07 DIAGNOSIS — Z13220 Encounter for screening for lipoid disorders: Secondary | ICD-10-CM | POA: Diagnosis not present

## 2019-12-07 DIAGNOSIS — K644 Residual hemorrhoidal skin tags: Secondary | ICD-10-CM | POA: Diagnosis not present

## 2019-12-07 DIAGNOSIS — Z0000 Encounter for general adult medical examination without abnormal findings: Secondary | ICD-10-CM | POA: Diagnosis not present

## 2019-12-07 DIAGNOSIS — N941 Unspecified dyspareunia: Secondary | ICD-10-CM | POA: Diagnosis not present

## 2019-12-07 DIAGNOSIS — H8109 Meniere's disease, unspecified ear: Secondary | ICD-10-CM | POA: Diagnosis not present

## 2019-12-09 DIAGNOSIS — Z1211 Encounter for screening for malignant neoplasm of colon: Secondary | ICD-10-CM | POA: Diagnosis not present
